# Patient Record
Sex: Female | Born: 1975 | Race: White | Hispanic: No | Marital: Married | Smoking: Former smoker
Health system: Southern US, Community
[De-identification: ages and names within clinical notes are randomized; demographics above are authoritative.]

## PROBLEM LIST (undated history)

## (undated) ENCOUNTER — Inpatient Hospital Stay (HOSPITAL_COMMUNITY): Payer: Self-pay

## (undated) DIAGNOSIS — C419 Malignant neoplasm of bone and articular cartilage, unspecified: Secondary | ICD-10-CM

## (undated) DIAGNOSIS — Z5189 Encounter for other specified aftercare: Secondary | ICD-10-CM

## (undated) DIAGNOSIS — I10 Essential (primary) hypertension: Secondary | ICD-10-CM

## (undated) DIAGNOSIS — O30009 Twin pregnancy, unspecified number of placenta and unspecified number of amniotic sacs, unspecified trimester: Secondary | ICD-10-CM

## (undated) DIAGNOSIS — F429 Obsessive-compulsive disorder, unspecified: Secondary | ICD-10-CM

## (undated) DIAGNOSIS — N289 Disorder of kidney and ureter, unspecified: Secondary | ICD-10-CM

## (undated) DIAGNOSIS — F419 Anxiety disorder, unspecified: Secondary | ICD-10-CM

## (undated) DIAGNOSIS — O09519 Supervision of elderly primigravida, unspecified trimester: Secondary | ICD-10-CM

## (undated) HISTORY — DX: Twin pregnancy, unspecified number of placenta and unspecified number of amniotic sacs, unspecified trimester: O30.009

## (undated) HISTORY — DX: Anxiety disorder, unspecified: F41.9

## (undated) HISTORY — PX: NO PAST SURGERIES: SHX2092

## (undated) HISTORY — PX: WISDOM TOOTH EXTRACTION: SHX21

## (undated) HISTORY — DX: Malignant neoplasm of bone and articular cartilage, unspecified: C41.9

## (undated) HISTORY — PX: LEEP: SHX91

## (undated) HISTORY — PX: OTHER SURGICAL HISTORY: SHX169

## (undated) HISTORY — DX: Obsessive-compulsive disorder, unspecified: F42.9

## (undated) HISTORY — DX: Encounter for other specified aftercare: Z51.89

---

## 2006-07-21 ENCOUNTER — Emergency Department: Payer: Self-pay

## 2009-12-03 HISTORY — PX: TUMOR REMOVAL: SHX12

## 2011-11-06 ENCOUNTER — Encounter (HOSPITAL_COMMUNITY): Payer: Self-pay

## 2012-01-04 ENCOUNTER — Ambulatory Visit (HOSPITAL_COMMUNITY)
Admission: RE | Admit: 2012-01-04 | Discharge: 2012-01-04 | Disposition: A | Discharge status: Home / Self Care | Payer: Medicare Other | Source: Ambulatory Visit | Attending: Gynecology | Admitting: Gynecology

## 2012-01-04 ENCOUNTER — Other Ambulatory Visit: Payer: Self-pay

## 2012-01-15 NOTE — Progress Notes (Signed)
MFM Note  Jackie Wilson is a 36 year old G49 Caucasian female who presents for preconception counseling regarding her chronic renal disease. 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 At this time she is considered disease free; however, due to her treatments, she has acquired chronic kidney disease (stage 3) and chronic hypertension. Her serum creatinine has remained at ~ 1.8 mg/dl for the last few years and she is currently not requiring an antihypertensive medication. Otherwise, she has no other medical or surgical conditions.  We had a very detailed discussion regarding the effects of renal disease on pregnancy and how pregnancy may effect her kidney disease. She understands that she would be at increased risk for preeclampsia, eclampsia, CVA and death. The fetus is at risk for severe growth restriction, very premature delivery, abruption and intrauterine demise. Pregnancy will most likely cause a transient decrease in renal function and, in a small percentage, cause permanent, irreversible damage leading to end-stage renal disease. Another key component to the outcome is how well the hypertension can be controlled.  Given the above risks, however, good outcomes are possible.  Assessment: 1) Moderate chronic kidney disease - stage 3 secondary to chemotherapy 2) Osteogenic sarcoma of right femur; S/P resection and chemotherapy in 2000 4) Chronic hypertension - currently not requiring medication 5) AMA  Recommendations: 1) Ok for pregnancy as long as Jackie Wilson understands the risks to herself and to the pregnancy 2) Baseline labs for renal and liver function; 24 hour urine for Cr Cl and protein; check for asymptomatic bacteriuria; then repeat each trimester 3) Aggressive preconception treatment of hypertension - would begin Rx with BPs > 140/90; starting with Labetalol 4) Very close monitoring for s/s of  preeclampsia 5) Early dating [place] 6) Serial USs for growth 7) Antenatal testing in the early third trimester 8) Continue to be seen by Dr. Hassell Done during pregnancy 9) Offer genetic counseling for [facility] options  Thank you for the kind referral.  (Face-to-face consultation with patient: 30 min)

## 2012-01-21 ENCOUNTER — Other Ambulatory Visit: Payer: Self-pay | Admitting: Gynecology

## 2012-03-06 LAB — OB RESULTS CONSOLE ABO/RH: RH Type: POSITIVE

## 2012-03-06 LAB — OB RESULTS CONSOLE RPR: RPR: NONREACTIVE

## 2012-03-13 LAB — OB RESULTS CONSOLE GC/CHLAMYDIA
Chlamydia: NEGATIVE
Gonorrhea: NEGATIVE

## 2012-07-15 ENCOUNTER — Other Ambulatory Visit: Payer: Self-pay

## 2012-07-22 ENCOUNTER — Other Ambulatory Visit (HOSPITAL_COMMUNITY): Payer: Self-pay | Admitting: Obstetrics and Gynecology

## 2012-07-22 ENCOUNTER — Ambulatory Visit (HOSPITAL_COMMUNITY): Payer: Medicare Other

## 2012-07-22 DIAGNOSIS — IMO0001 Reserved for inherently not codable concepts without codable children: Secondary | ICD-10-CM

## 2012-07-22 DIAGNOSIS — O2690 Pregnancy related conditions, unspecified, unspecified trimester: Secondary | ICD-10-CM

## 2012-07-30 ENCOUNTER — Other Ambulatory Visit (HOSPITAL_COMMUNITY): Payer: Medicare Other

## 2012-07-31 ENCOUNTER — Encounter (HOSPITAL_COMMUNITY): Payer: Self-pay

## 2012-07-31 ENCOUNTER — Other Ambulatory Visit (HOSPITAL_COMMUNITY): Payer: Self-pay | Admitting: Obstetrics and Gynecology

## 2012-07-31 ENCOUNTER — Ambulatory Visit (HOSPITAL_COMMUNITY)
Admission: RE | Admit: 2012-07-31 | Discharge: 2012-07-31 | Disposition: A | Discharge status: Home / Self Care | Payer: BC Managed Care – PPO | Source: Ambulatory Visit | Attending: Obstetrics and Gynecology | Admitting: Obstetrics and Gynecology

## 2012-07-31 DIAGNOSIS — O2690 Pregnancy related conditions, unspecified, unspecified trimester: Secondary | ICD-10-CM

## 2012-07-31 DIAGNOSIS — O30009 Twin pregnancy, unspecified number of placenta and unspecified number of amniotic sacs, unspecified trimester: Secondary | ICD-10-CM | POA: Insufficient documentation

## 2012-07-31 DIAGNOSIS — IMO0001 Reserved for inherently not codable concepts without codable children: Secondary | ICD-10-CM

## 2012-07-31 DIAGNOSIS — O09519 Supervision of elderly primigravida, unspecified trimester: Secondary | ICD-10-CM | POA: Insufficient documentation

## 2012-07-31 DIAGNOSIS — O26839 Pregnancy related renal disease, unspecified trimester: Secondary | ICD-10-CM | POA: Insufficient documentation

## 2012-08-04 ENCOUNTER — Inpatient Hospital Stay (HOSPITAL_COMMUNITY)
Admission: AD | Admit: 2012-08-04 | Discharge: 2012-08-04 | Disposition: A | Discharge status: Home / Self Care | Payer: BC Managed Care – PPO | Source: Ambulatory Visit | Attending: Obstetrics and Gynecology | Admitting: Obstetrics and Gynecology

## 2012-08-04 ENCOUNTER — Encounter (HOSPITAL_COMMUNITY): Payer: Self-pay

## 2012-08-04 DIAGNOSIS — O30009 Twin pregnancy, unspecified number of placenta and unspecified number of amniotic sacs, unspecified trimester: Secondary | ICD-10-CM | POA: Insufficient documentation

## 2012-08-04 HISTORY — DX: Malignant neoplasm of bone and articular cartilage, unspecified: C41.9

## 2012-08-04 HISTORY — DX: Essential (primary) hypertension: I10

## 2012-08-04 HISTORY — DX: Disorder of kidney and ureter, unspecified: N28.9

## 2012-08-04 HISTORY — DX: Supervision of elderly primigravida, unspecified trimester: O09.519

## 2012-08-04 NOTE — MAU Note (Signed)
Pt here for routine NST, denies pain, bleeding or vaginal discharge. Twin gestation, baby a maternal left, vertex, baby b maternal right vertex

## 2012-08-12 ENCOUNTER — Other Ambulatory Visit: Payer: Medicare Other

## 2012-08-12 ENCOUNTER — Ambulatory Visit (INDEPENDENT_AMBULATORY_CARE_PROVIDER_SITE_OTHER): Payer: Medicare Other | Admitting: *Deleted

## 2012-08-12 VITALS — BP 117/78 | Wt 192.7 lb

## 2012-08-12 DIAGNOSIS — Z85830 Personal history of malignant neoplasm of bone: Secondary | ICD-10-CM

## 2012-08-12 DIAGNOSIS — O30009 Twin pregnancy, unspecified number of placenta and unspecified number of amniotic sacs, unspecified trimester: Secondary | ICD-10-CM

## 2012-08-12 DIAGNOSIS — N183 Chronic kidney disease, stage 3 unspecified: Secondary | ICD-10-CM

## 2012-08-12 DIAGNOSIS — O09519 Supervision of elderly primigravida, unspecified trimester: Secondary | ICD-10-CM

## 2012-08-12 HISTORY — DX: Twin pregnancy, unspecified number of placenta and unspecified number of amniotic sacs, unspecified trimester: O30.009

## 2012-08-12 NOTE — Progress Notes (Signed)
P = 92   Copy of report and tracing sent to Dr. Helane Rima w/pt today

## 2012-08-15 ENCOUNTER — Ambulatory Visit (INDEPENDENT_AMBULATORY_CARE_PROVIDER_SITE_OTHER): Payer: Medicare Other | Admitting: *Deleted

## 2012-08-15 VITALS — BP 108/68 | Wt 193.1 lb

## 2012-08-15 DIAGNOSIS — O30009 Twin pregnancy, unspecified number of placenta and unspecified number of amniotic sacs, unspecified trimester: Secondary | ICD-10-CM

## 2012-08-15 NOTE — Progress Notes (Signed)
P = 91   Copy of report and tracing sent to Dr. Gaetano Net w/pt today.

## 2012-08-19 ENCOUNTER — Ambulatory Visit (INDEPENDENT_AMBULATORY_CARE_PROVIDER_SITE_OTHER): Payer: Medicare Other | Admitting: *Deleted

## 2012-08-19 VITALS — BP 103/62 | Wt 194.8 lb

## 2012-08-19 DIAGNOSIS — O30009 Twin pregnancy, unspecified number of placenta and unspecified number of amniotic sacs, unspecified trimester: Secondary | ICD-10-CM

## 2012-08-19 NOTE — Progress Notes (Signed)
MFM Notes  Jackie Wilson is a 36 year old G67 Caucasian female with a di/di twin gestation at 31+ weeks who presents for consultation regarding her renal disease and recent changes in her renal function tests. I met Jackie Wilson earlier this year for a preconception consultation due to her chronic renal disease secondary to chemotherapy for osteogenic sarcoma (please see note from 02/13). Since that time she has conceived with a di/di twin gestation. Her prenatal course has been uneventful to this point. A serum Cr, CrCl and 24 hour protein were performed on 08/13. These results were compared to those performed on 01/22/12. The serum creatinine improved slightly going from 1.78 mg/dl to 1.59 mg/dl. Her CrCl also improved with a clearance of 53 ml/min to 60 ml/min. The only change that was significantly different was the total protein that went from 154 mg/day to 728 mg/day.   BP today was 105/64. An US revealed normally grown fetuses, normal AFVs and no third trimester anomalies.  We discussed that the increase in proteinuria was concerning for being a possible early sign of preeclampsia and/or worsening renal function. Therefore, at this point, I suggest 1) starting twice weekly testing (NSTs with weekly fluid assessments); 2) continuing serial USs for growth; 3) assessing her BP at each visit (twice weekly) and 4) obtaining blood work and a 24 hour urine every 2-3 weeks. As long as her BPs remain normal and HELLP is ruled-out, outpatient management is reasonable. If the proteinuria continues to increase with all else stable, would consider a 36 week delivery; otherwise deliver at 38 weeks.  It was nice seeing Jackie Wilson again! Please call with questions or concerns.  (Face-to-face consultation with patient: 25 min)

## 2012-08-19 NOTE — Progress Notes (Signed)
P = 91    Copy of report and tracing sent to Dr. Corinna Capra w/pt today

## 2012-08-22 ENCOUNTER — Ambulatory Visit (INDEPENDENT_AMBULATORY_CARE_PROVIDER_SITE_OTHER): Payer: Medicaid Other | Admitting: *Deleted

## 2012-08-22 VITALS — BP 108/61 | Wt 195.5 lb

## 2012-08-22 DIAGNOSIS — O30009 Twin pregnancy, unspecified number of placenta and unspecified number of amniotic sacs, unspecified trimester: Secondary | ICD-10-CM

## 2012-08-22 NOTE — Progress Notes (Signed)
P = 78   Copy of report and tracing sent to Dr. Matthew Saras w/pt today.

## 2012-08-25 ENCOUNTER — Ambulatory Visit (INDEPENDENT_AMBULATORY_CARE_PROVIDER_SITE_OTHER): Payer: Medicare Other | Admitting: *Deleted

## 2012-08-25 VITALS — BP 106/67 | Wt 196.8 lb

## 2012-08-25 DIAGNOSIS — O30009 Twin pregnancy, unspecified number of placenta and unspecified number of amniotic sacs, unspecified trimester: Secondary | ICD-10-CM

## 2012-08-25 NOTE — Progress Notes (Signed)
P = 90   Copy of report and tracing sent to Dr. Corinna Capra w/pt today

## 2012-08-29 ENCOUNTER — Ambulatory Visit (INDEPENDENT_AMBULATORY_CARE_PROVIDER_SITE_OTHER): Payer: Medicare Other | Admitting: *Deleted

## 2012-08-29 VITALS — BP 102/65 | Wt 198.0 lb

## 2012-08-29 DIAGNOSIS — O30009 Twin pregnancy, unspecified number of placenta and unspecified number of amniotic sacs, unspecified trimester: Secondary | ICD-10-CM

## 2012-08-29 NOTE — Progress Notes (Signed)
P = 81  Copy of NST report and tracing sent to Dr. Helane Rima w/pt today

## 2012-09-01 ENCOUNTER — Ambulatory Visit (INDEPENDENT_AMBULATORY_CARE_PROVIDER_SITE_OTHER): Payer: Medicare Other | Admitting: *Deleted

## 2012-09-01 VITALS — BP 104/68 | Wt 198.5 lb

## 2012-09-01 DIAGNOSIS — O30009 Twin pregnancy, unspecified number of placenta and unspecified number of amniotic sacs, unspecified trimester: Secondary | ICD-10-CM

## 2012-09-01 NOTE — Progress Notes (Signed)
P = 89   Copy of NST report and tracing sent to Dr. Corinna Capra w/pt today

## 2012-09-05 ENCOUNTER — Ambulatory Visit (INDEPENDENT_AMBULATORY_CARE_PROVIDER_SITE_OTHER): Payer: Medicare Other | Admitting: *Deleted

## 2012-09-05 VITALS — BP 106/68 | Wt 198.5 lb

## 2012-09-05 DIAGNOSIS — O30009 Twin pregnancy, unspecified number of placenta and unspecified number of amniotic sacs, unspecified trimester: Secondary | ICD-10-CM

## 2012-09-05 NOTE — Progress Notes (Signed)
P = 82   Copy of NST report and tracing faxed to Dr. Corinna Capra.  Pt is scheduled for IOL on 09/08/12

## 2012-09-08 ENCOUNTER — Encounter (HOSPITAL_COMMUNITY): Payer: Self-pay | Admitting: Anesthesiology

## 2012-09-08 ENCOUNTER — Encounter (HOSPITAL_COMMUNITY)
Admission: RE | Disposition: A | Discharge status: Home / Self Care | Payer: Self-pay | Source: Ambulatory Visit | Attending: Obstetrics and Gynecology

## 2012-09-08 ENCOUNTER — Inpatient Hospital Stay (HOSPITAL_COMMUNITY)
Admission: RE | Admit: 2012-09-08 | Discharge: 2012-09-11 | DRG: 371 | Disposition: A | Discharge status: Home / Self Care | Payer: BC Managed Care – PPO | Source: Ambulatory Visit | Attending: Obstetrics and Gynecology | Admitting: Obstetrics and Gynecology

## 2012-09-08 ENCOUNTER — Encounter (HOSPITAL_COMMUNITY): Payer: Self-pay

## 2012-09-08 ENCOUNTER — Other Ambulatory Visit: Payer: Medicare Other

## 2012-09-08 ENCOUNTER — Inpatient Hospital Stay (HOSPITAL_COMMUNITY): Payer: BC Managed Care – PPO | Admitting: Anesthesiology

## 2012-09-08 DIAGNOSIS — Z85830 Personal history of malignant neoplasm of bone: Secondary | ICD-10-CM

## 2012-09-08 DIAGNOSIS — O09519 Supervision of elderly primigravida, unspecified trimester: Secondary | ICD-10-CM | POA: Diagnosis present

## 2012-09-08 DIAGNOSIS — Z22330 Carrier of Group B streptococcus: Secondary | ICD-10-CM

## 2012-09-08 DIAGNOSIS — O99892 Other specified diseases and conditions complicating childbirth: Secondary | ICD-10-CM | POA: Diagnosis present

## 2012-09-08 DIAGNOSIS — O30009 Twin pregnancy, unspecified number of placenta and unspecified number of amniotic sacs, unspecified trimester: Secondary | ICD-10-CM | POA: Diagnosis present

## 2012-09-08 DIAGNOSIS — O139 Gestational [pregnancy-induced] hypertension without significant proteinuria, unspecified trimester: Secondary | ICD-10-CM | POA: Diagnosis present

## 2012-09-08 LAB — COMPREHENSIVE METABOLIC PANEL
ALT: 21 U/L (ref 0–35)
Alkaline Phosphatase: 215 U/L — ABNORMAL HIGH (ref 39–117)
CO2: 20 mEq/L (ref 19–32)
Calcium: 8.9 mg/dL (ref 8.4–10.5)
GFR calc Af Amer: 37 mL/min — ABNORMAL LOW (ref >90)
GFR calc non Af Amer: 32 mL/min — ABNORMAL LOW (ref >90)
Glucose, Bld: 84 mg/dL (ref 70–99)
Potassium: 4 mEq/L (ref 3.5–5.1)
Sodium: 135 mEq/L (ref 135–145)
Total Bilirubin: 0.2 mg/dL — ABNORMAL LOW (ref 0.3–1.2)

## 2012-09-08 LAB — CBC
HCT: 35.7 % — ABNORMAL LOW (ref 36.0–46.0)
HCT: 34 % — ABNORMAL LOW (ref 36.0–46.0)
HCT: 28.2 % — ABNORMAL LOW (ref 36.0–46.0)
Hemoglobin: 11.9 g/dL — ABNORMAL LOW (ref 12.0–15.0)
Hemoglobin: 9.5 g/dL — ABNORMAL LOW (ref 12.0–15.0)
MCH: 27.7 pg (ref 26.0–34.0)
MCHC: 33.3 g/dL (ref 30.0–36.0)
MCHC: 32.6 g/dL (ref 30.0–36.0)
MCHC: 33.7 g/dL (ref 30.0–36.0)
MCV: 82.9 fL (ref 78.0–100.0)
Platelets: 127 10*3/uL — ABNORMAL LOW (ref 150–400)
RBC: 3.43 MIL/uL — ABNORMAL LOW (ref 3.87–5.11)
RDW: 13.5 % (ref 11.5–15.5)

## 2012-09-08 LAB — PREPARE RBC (CROSSMATCH)

## 2012-09-08 SURGERY — Surgical Case
Anesthesia: Regional

## 2012-09-08 MED ORDER — SODIUM BICARBONATE 8.4 % IV SOLN
INTRAVENOUS | Status: DC | PRN
  Administered 2012-09-08 (×2): 5 mL via EPIDURAL

## 2012-09-08 MED ORDER — OXYCODONE-ACETAMINOPHEN 5-325 MG PO TABS: 1.0000 | ORAL_TABLET | ORAL | Status: DC | PRN

## 2012-09-08 MED ORDER — LIDOCAINE-EPINEPHRINE (PF) 2 %-1:200000 IJ SOLN
INTRAMUSCULAR | Status: AC
  Filled 2012-09-08: qty 20

## 2012-09-08 MED ORDER — PHENYLEPHRINE 40 MCG/ML (10ML) SYRINGE FOR IV PUSH (FOR BLOOD PRESSURE SUPPORT): 80.0000 ug | PREFILLED_SYRINGE | INTRAVENOUS | Status: DC | PRN

## 2012-09-08 MED ORDER — FLEET ENEMA 7-19 GM/118ML RE ENEM: 1.0000 | ENEMA | Freq: Every day | RECTAL | Status: DC | PRN

## 2012-09-08 MED ORDER — ONDANSETRON HCL 4 MG/2ML IJ SOLN
INTRAMUSCULAR | Status: DC | PRN
  Administered 2012-09-08: 4 mg via INTRAVENOUS

## 2012-09-08 MED ORDER — IBUPROFEN 600 MG PO TABS: 600.0000 mg | ORAL_TABLET | Freq: Four times a day (QID) | ORAL | Status: DC | PRN

## 2012-09-08 MED ORDER — ACETAMINOPHEN 325 MG PO TABS: 650.0000 mg | ORAL_TABLET | ORAL | Status: DC | PRN

## 2012-09-08 MED ORDER — ACETAMINOPHEN 10 MG/ML IV SOLN
1000.0000 mg | Freq: Once | INTRAVENOUS | Status: DC | PRN
  Filled 2012-09-08: qty 100

## 2012-09-08 MED ORDER — PHENYLEPHRINE HCL 10 MG/ML IJ SOLN
INTRAMUSCULAR | Status: DC | PRN
  Administered 2012-09-08 (×3): 40 ug via INTRAVENOUS
  Administered 2012-09-08: 80 ug via INTRAVENOUS
  Administered 2012-09-08 (×2): 40 ug via INTRAVENOUS
  Administered 2012-09-08: 80 ug via INTRAVENOUS
  Administered 2012-09-08: 40 ug via INTRAVENOUS

## 2012-09-08 MED ORDER — MEPERIDINE HCL 25 MG/ML IJ SOLN: 6.2500 mg | INTRAMUSCULAR | Status: DC | PRN

## 2012-09-08 MED ORDER — PHENYLEPHRINE 40 MCG/ML (10ML) SYRINGE FOR IV PUSH (FOR BLOOD PRESSURE SUPPORT)
PREFILLED_SYRINGE | INTRAVENOUS | Status: AC
  Filled 2012-09-08: qty 5

## 2012-09-08 MED ORDER — OXYTOCIN BOLUS FROM INFUSION
500.0000 mL | Freq: Once | INTRAVENOUS | Status: DC
  Filled 2012-09-08: qty 500

## 2012-09-08 MED ORDER — DEXTROSE 5 % IV SOLN
2.0000 g | INTRAVENOUS | Status: DC
  Filled 2012-09-08: qty 2

## 2012-09-08 MED ORDER — MORPHINE SULFATE 0.5 MG/ML IJ SOLN
INTRAMUSCULAR | Status: AC
  Filled 2012-09-08: qty 10

## 2012-09-08 MED ORDER — PRENATAL MULTIVITAMIN CH: 1.0000 | ORAL_TABLET | Freq: Every day | ORAL | Status: DC

## 2012-09-08 MED ORDER — OXYTOCIN 40 UNITS IN LACTATED RINGERS INFUSION - SIMPLE MED: 62.5000 mL/h | Freq: Once | INTRAVENOUS | Status: DC

## 2012-09-08 MED ORDER — DIPHENHYDRAMINE HCL 50 MG/ML IJ SOLN: 12.5000 mg | INTRAMUSCULAR | Status: DC | PRN

## 2012-09-08 MED ORDER — LACTATED RINGERS IV SOLN
500.0000 mL | Freq: Once | INTRAVENOUS | Status: DC
  Administered 2012-09-08: 1000 mL via INTRAVENOUS

## 2012-09-08 MED ORDER — CITRIC ACID-SODIUM CITRATE 334-500 MG/5ML PO SOLN
30.0000 mL | ORAL | Status: DC | PRN
  Administered 2012-09-08: 30 mL via ORAL
  Filled 2012-09-08: qty 15

## 2012-09-08 MED ORDER — SODIUM CHLORIDE 0.9 % IV SOLN
2.0000 g | Freq: Four times a day (QID) | INTRAVENOUS | Status: DC
  Administered 2012-09-08 (×2): 2 g via INTRAVENOUS
  Filled 2012-09-08 (×4): qty 2000

## 2012-09-08 MED ORDER — EPHEDRINE 5 MG/ML INJ: 10.0000 mg | INTRAVENOUS | Status: DC | PRN

## 2012-09-08 MED ORDER — LIDOCAINE HCL (PF) 1 % IJ SOLN: 30.0000 mL | INTRAMUSCULAR | Status: DC | PRN

## 2012-09-08 MED ORDER — LIDOCAINE HCL (PF) 1 % IJ SOLN
INTRAMUSCULAR | Status: DC | PRN
  Administered 2012-09-08 (×2): 5 mL

## 2012-09-08 MED ORDER — EPHEDRINE 5 MG/ML INJ
INTRAVENOUS | Status: AC
  Filled 2012-09-08: qty 10

## 2012-09-08 MED ORDER — FENTANYL CITRATE 0.05 MG/ML IJ SOLN: 25.0000 ug | INTRAMUSCULAR | Status: DC | PRN

## 2012-09-08 MED ORDER — SODIUM BICARBONATE 8.4 % IV SOLN
INTRAVENOUS | Status: AC
  Filled 2012-09-08: qty 50

## 2012-09-08 MED ORDER — TERBUTALINE SULFATE 1 MG/ML IJ SOLN: 0.2500 mg | Freq: Once | INTRAMUSCULAR | Status: DC | PRN

## 2012-09-08 MED ORDER — ONDANSETRON HCL 4 MG/2ML IJ SOLN: 4.0000 mg | Freq: Four times a day (QID) | INTRAMUSCULAR | Status: DC | PRN

## 2012-09-08 MED ORDER — PHENYLEPHRINE 40 MCG/ML (10ML) SYRINGE FOR IV PUSH (FOR BLOOD PRESSURE SUPPORT)
PREFILLED_SYRINGE | INTRAVENOUS | Status: AC
  Filled 2012-09-08: qty 15

## 2012-09-08 MED ORDER — MEPERIDINE HCL 25 MG/ML IJ SOLN
INTRAMUSCULAR | Status: AC
  Filled 2012-09-08: qty 1

## 2012-09-08 MED ORDER — MEPERIDINE HCL 25 MG/ML IJ SOLN
INTRAMUSCULAR | Status: DC | PRN
  Administered 2012-09-08: 12.5 mg via INTRAVENOUS

## 2012-09-08 MED ORDER — LACTATED RINGERS IV SOLN
INTRAVENOUS | Status: DC
  Administered 2012-09-08 (×3): via INTRAVENOUS

## 2012-09-08 MED ORDER — FENTANYL 2.5 MCG/ML BUPIVACAINE 1/10 % EPIDURAL INFUSION (WH - ANES)
14.0000 mL/h | INTRAMUSCULAR | Status: DC
Start: 2012-09-08 — End: 2012-09-08
  Administered 2012-09-08: 14 mL/h via EPIDURAL
  Filled 2012-09-08: qty 125

## 2012-09-08 MED ORDER — MORPHINE SULFATE (PF) 0.5 MG/ML IJ SOLN
INTRAMUSCULAR | Status: DC | PRN
  Administered 2012-09-08: 4 mg via EPIDURAL

## 2012-09-08 MED ORDER — OXYTOCIN 40 UNITS IN LACTATED RINGERS INFUSION - SIMPLE MED
1.0000 m[IU]/min | INTRAVENOUS | Status: DC
  Administered 2012-09-08: 2 m[IU]/min via INTRAVENOUS
  Filled 2012-09-08: qty 1000

## 2012-09-08 MED ORDER — SCOPOLAMINE 1 MG/3DAYS TD PT72
MEDICATED_PATCH | TRANSDERMAL | Status: AC
  Administered 2012-09-08: 1.5 mg via TRANSDERMAL
  Filled 2012-09-08: qty 1

## 2012-09-08 MED ORDER — LACTATED RINGERS IV SOLN
500.0000 mL | INTRAVENOUS | Status: DC | PRN
  Administered 2012-09-08: 1000 mL via INTRAVENOUS

## 2012-09-08 MED ORDER — LACTATED RINGERS IV SOLN
INTRAVENOUS | Status: DC | PRN
  Administered 2012-09-08 (×3): via INTRAVENOUS

## 2012-09-08 MED ORDER — OXYTOCIN 10 UNIT/ML IJ SOLN
40.0000 [IU] | INTRAVENOUS | Status: DC | PRN
  Administered 2012-09-08: 40 [IU] via INTRAVENOUS

## 2012-09-08 MED ORDER — ONDANSETRON HCL 4 MG/2ML IJ SOLN
INTRAMUSCULAR | Status: AC
  Filled 2012-09-08: qty 2

## 2012-09-08 MED ORDER — DEXTROSE 5 % IV SOLN
2.0000 g | INTRAVENOUS | Status: DC | PRN
  Administered 2012-09-08: 2 g via INTRAVENOUS

## 2012-09-08 MED ORDER — PHENYLEPHRINE 40 MCG/ML (10ML) SYRINGE FOR IV PUSH (FOR BLOOD PRESSURE SUPPORT)
80.0000 ug | PREFILLED_SYRINGE | INTRAVENOUS | Status: DC | PRN
  Filled 2012-09-08: qty 5

## 2012-09-08 MED ORDER — EPHEDRINE 5 MG/ML INJ
10.0000 mg | INTRAVENOUS | Status: DC | PRN
  Filled 2012-09-08: qty 4

## 2012-09-08 MED ORDER — SCOPOLAMINE 1 MG/3DAYS TD PT72
1.0000 | MEDICATED_PATCH | Freq: Once | TRANSDERMAL | Status: DC
  Administered 2012-09-08: 1.5 mg via TRANSDERMAL

## 2012-09-08 MED ORDER — OXYTOCIN 10 UNIT/ML IJ SOLN
INTRAMUSCULAR | Status: AC
  Filled 2012-09-08: qty 4

## 2012-09-08 SURGICAL SUPPLY — 34 items
CLOTH BEACON ORANGE TIMEOUT ST (SAFETY) ×2 IMPLANT
DRAPE SURG 17X23 STRL (DRAPES) ×2 IMPLANT
DRESSING TELFA 8X3 (GAUZE/BANDAGES/DRESSINGS) IMPLANT
DRSG COVADERM 4X10 (GAUZE/BANDAGES/DRESSINGS) ×4 IMPLANT
DURAPREP 26ML APPLICATOR (WOUND CARE) ×2 IMPLANT
ELECT REM PT RETURN 9FT ADLT (ELECTROSURGICAL) ×2
ELECTRODE REM PT RTRN 9FT ADLT (ELECTROSURGICAL) ×1 IMPLANT
EXTRACTOR VACUUM M CUP 4 TUBE (SUCTIONS) IMPLANT
GAUZE SPONGE 4X4 12PLY STRL LF (GAUZE/BANDAGES/DRESSINGS) IMPLANT
GLOVE BIO SURGEON STRL SZ8 (GLOVE) ×2 IMPLANT
GLOVE BIOGEL PI IND STRL 7.5 (GLOVE) ×1 IMPLANT
GLOVE BIOGEL PI INDICATOR 7.5 (GLOVE) ×1
GLOVE SURG ORTHO 8.0 STRL STRW (GLOVE) ×2 IMPLANT
GLOVE SURG SS PI 7.5 STRL IVOR (GLOVE) ×2 IMPLANT
GOWN PREVENTION PLUS LG XLONG (DISPOSABLE) ×4 IMPLANT
KIT ABG SYR 3ML LUER SLIP (SYRINGE) ×4 IMPLANT
NEEDLE HYPO 25X5/8 SAFETYGLIDE (NEEDLE) ×4 IMPLANT
NS IRRIG 1000ML POUR BTL (IV SOLUTION) ×2 IMPLANT
PACK C SECTION WH (CUSTOM PROCEDURE TRAY) ×2 IMPLANT
PAD ABD 7.5X8 STRL (GAUZE/BANDAGES/DRESSINGS) IMPLANT
PAD OB MATERNITY 4.3X12.25 (PERSONAL CARE ITEMS) ×2 IMPLANT
SLEEVE SCD COMPRESS KNEE MED (MISCELLANEOUS) ×2 IMPLANT
STAPLER VISISTAT 35W (STAPLE) IMPLANT
SUT MNCRL 0 VIOLET CTX 36 (SUTURE) ×3 IMPLANT
SUT MONOCRYL 0 CTX 36 (SUTURE) ×3
SUT PDS AB 1 CT  36 (SUTURE) ×1
SUT PDS AB 1 CT 36 (SUTURE) ×1 IMPLANT
SUT PLAIN 2 0 XLH (SUTURE) ×2 IMPLANT
SUT VIC AB 1 CTX 36 (SUTURE)
SUT VIC AB 1 CTX36XBRD ANBCTRL (SUTURE) IMPLANT
TOWEL OR 17X24 6PK STRL BLUE (TOWEL DISPOSABLE) ×4 IMPLANT
TRAY FOLEY CATH 14FR (SET/KITS/TRAYS/PACK) IMPLANT
WATER STERILE IRR 1000ML POUR (IV SOLUTION) ×2 IMPLANT
YANKAUER SUCT BULB TIP NO VENT (SUCTIONS) ×2 IMPLANT

## 2012-09-08 NOTE — Progress Notes (Signed)
Patient ID: Jackie Wilson, female   DOB: [birth year], 36 y.o.   MRN: [id number] CTSP for recurrent late decels and then bradycardia for twin a Responded to Pitocin d/c and IVF, O2 and position change Now with moderate varibility and no decels FHR B moderate varibility and no decels Ctxs were q 2-3 minutes now q 5-7 Cx: 3/90/-2  Fetal intolerance to labor.  Plan Primary C/S Risks and benefits of C/S were discussed.  All questions were answered and informed consent was obtained.  Plan to proceed with low segment transverse Cesarean Section.   DL

## 2012-09-08 NOTE — Anesthesia Procedure Notes (Signed)
Epidural Patient location during procedure: OB Start time: 09/08/2012 5:05 PM  Staffing Anesthesiologist: Rudean Curt R Performed by: anesthesiologist   Preanesthetic Checklist Completed: patient identified, site marked, surgical consent, pre-op evaluation, timeout performed, IV checked, risks and benefits discussed and monitors and equipment checked  Epidural Patient position: sitting Prep: site prepped and draped and DuraPrep Patient monitoring: continuous pulse ox and blood pressure Approach: midline Injection technique: LOR air and LOR saline  Needle:  Needle type: Tuohy  Needle gauge: 17 G Needle length: 9 cm and 9 Needle insertion depth: 5 cm cm Catheter type: closed end flexible Catheter size: 19 Gauge Catheter at skin depth: 10 cm Test dose: negative  Assessment Events: blood not aspirated, injection not painful, no injection resistance, negative IV test and no paresthesia  Additional Notes Patient identified.  Risk benefits discussed including failed block, incomplete pain control, headache, nerve damage, paralysis, blood pressure changes, nausea, vomiting, reactions to medication both toxic or allergic, and postpartum back pain.  Patient expressed understanding and wished to proceed.  All questions were answered.  Sterile technique used throughout procedure and epidural site dressed with sterile barrier dressing. No paresthesia or other complications noted.The patient did not experience any signs of intravascular injection such as tinnitus or metallic taste in mouth nor signs of intrathecal spread such as rapid motor block. Please see nursing notes for vital signs.

## 2012-09-08 NOTE — Anesthesia Preprocedure Evaluation (Signed)
Anesthesia Evaluation  Patient identified by MRN, date of birth, ID band Patient awake    Reviewed: Allergy & Precautions, H&P , Patient's Chart, lab work & pertinent test results  Airway Mallampati: II TM Distance: >3 FB Neck ROM: full    Dental No notable dental hx.    Pulmonary neg pulmonary ROS,  breath sounds clear to auscultation  Pulmonary exam normal       Cardiovascular hypertension, negative cardio ROS  Rhythm:regular Rate:Normal     Neuro/Psych negative neurological ROS  negative psych ROS   GI/Hepatic negative GI ROS, Neg liver ROS,   Endo/Other  negative endocrine ROS  Renal/GU CRFRenal diseasenegative Renal ROS     Musculoskeletal   Abdominal   Peds  Hematology negative hematology ROS (+)   Anesthesia Other Findings Osteosarcoma     AMA (advanced maternal age) primigravida 35+        Renal insufficiency   Secondary to chemotherapy Hypertension    Reproductive/Obstetrics (+) Pregnancy                           Anesthesia Physical Anesthesia Plan  ASA: III  Anesthesia Plan: Epidural   Post-op Pain Management:    Induction:   Airway Management Planned:   Additional Equipment:   Intra-op Plan:   Post-operative Plan:   Informed Consent: I have reviewed the patients History and Physical, chart, labs and discussed the procedure including the risks, benefits and alternatives for the proposed anesthesia with the patient or authorized representative who has indicated his/her understanding and acceptance.     Plan Discussed with:   Anesthesia Plan Comments:         Anesthesia Quick Evaluation

## 2012-09-08 NOTE — H&P (Signed)
Jackie Wilson is a 36 y.o. female presenting for IOL due to twins at 25 weeks, early labor sxs, gestational HTN on bedrest and a history of renal insufficiency.  GBS +History OB History    Grav Para Term Preterm Abortions TAB SAB Ect Mult Living   1              Past Medical History  Diagnosis Date  . Osteosarcoma   . AMA (advanced maternal age) primigravida 42+   . Renal insufficiency     Secondary to chemotherapy  . Hypertension    Past Surgical History  Procedure Date  . Right leg replacement   . Leep    Family History: family history is negative for Other. Social History:  reports that she has never smoked. She has never used smokeless tobacco. She reports that she does not drink alcohol or use illicit drugs.   Prenatal Transfer Tool  Maternal Diabetes: No Genetic Screening: Normal Maternal Ultrasounds/Referrals: Normal Fetal Ultrasounds or other Referrals:  None Maternal Substance Abuse:  No Significant Maternal Medications:  None Significant Maternal Lab Results:  None Other Comments:  None  ROS    Blood pressure 102/57, pulse 73, temperature 98 F (36.7 C), temperature source Oral, resp. rate 16, height 5\' 3"  (1.6 m), weight 89.812 kg (198 lb), last menstrual period 12/24/2011. Exam Physical Exam   Cx 2/90/-2  AROM clear FSE placed Prenatal labs: ABO, Rh:   Antibody:   Rubella:   RPR:    HBsAg:    HIV:    GBS:     Assessment/Plan: Twins V/V at 37 weeks with early labor and CRI.  Has been on BR for 2 weeks for gest HTN with good results Desires vaginal delivery.  Plan double set up Abx for GBS Labs pending   Sabrina Arriaga C 09/08/2012, 9:43 AM

## 2012-09-08 NOTE — Transfer of Care (Signed)
Immediate Anesthesia Transfer of Care Note  Patient: Jackie Wilson  Procedure(s) Performed: Procedure(s) (LRB) with comments: CESAREAN SECTION (N/A)  Patient Location: PACU  Anesthesia Type: Epidural  Level of Consciousness: awake, alert  and oriented  Airway & Oxygen Therapy: Patient Spontanous Breathing  Post-op Assessment: Report given to PACU RN and Post -op Vital signs reviewed and stable  Post vital signs: Reviewed and stable  Complications: No apparent anesthesia complications

## 2012-09-08 NOTE — Anesthesia Postprocedure Evaluation (Signed)
Anesthesia Post Note  Patient: Jackie Wilson  Procedure(s) Performed: Procedure(s) (LRB): CESAREAN SECTION (N/A)  Anesthesia type: Epidural  Patient location: PACU  Post pain: Pain level controlled  Post assessment: Post-op Vital signs reviewed  Last Vitals:  Filed Vitals:   09/08/12 2230  BP: 101/71  Pulse: 93  Temp:   Resp: 24    Post vital signs: stable  Level of consciousness: awake  Complications: No apparent anesthesia complications

## 2012-09-08 NOTE — Op Note (Signed)
Cesarean Section Procedure Note  Pre-operative Diagnosis: Twins at 37 weeks, Fetal intolerance to labor  Post-operative Diagnosis: same  Surgeon: Luz Lex   Assistants: none  Anesthesia:epidural  Procedure:  Low Segment Transverse cesarean section  Procedure Details  The patient was seen in the Holding Room. The risks, benefits, complications, treatment options, and expected outcomes were discussed with the patient.  The patient concurred with the proposed plan, giving informed consent.  The site of surgery properly noted/marked.. A Time Out was held and the above information confirmed.  After induction of anesthesia, the patient was draped and prepped in the usual sterile manner. A Pfannenstiel incision was made and carried down through the subcutaneous tissue to the fascia. Fascial incision was made and extended transversely. The fascia was separated from the underlying rectus tissue superiorly and inferiorly. The peritoneum was identified and entered. Peritoneal incision was extended longitudinally. The utero-vesical peritoneal reflection was incised transversely and the bladder flap was bluntly freed from the lower uterine segment. A low transverse uterine incision was made. Delivered from vertex presentation was baby A (female) with Apgar scores of 9 at one minute and 9 at five minutes. After the umbilical cord was clamped and cut cord blood was obtained for evaluation. Baby B (female) AROM then delivered from vertex presentation with apgars of 9,9.  The placentas were removed intact and appeared normal with a clamp left on cord from A. The uterine outline, tubes and ovaries appeared normal. The uterine incision was closed with running locked sutures of 0 monocryl and imbricated with 0 monocryl. Hemostasis was observed. Lavage was carried out until clear. The peritoneum was then closed with 0 monocryl and rectus muscles plicated in the midline.  After hemostasis was assured, the fascia was  then reapproximated with running sutures of 1 PDS. Irrigation was applied and after adequate hemostasis was assured, the skin was reapproximated with staples.  Instrument, sponge, and needle counts were correct prior the abdominal closure and at the conclusion of the case. The patient received 2 grams cefotetan preoperatively.  Findings: Viable female (a), viable female (b)  Estimated Blood Loss:  700cc         Specimens: Placenta was sent to Pathology         Complications:  None

## 2012-09-09 ENCOUNTER — Encounter (HOSPITAL_COMMUNITY): Payer: Self-pay | Admitting: Obstetrics and Gynecology

## 2012-09-09 LAB — CBC
HCT: 27.2 % — ABNORMAL LOW (ref 36.0–46.0)
MCH: 28 pg (ref 26.0–34.0)
MCV: 82.7 fL (ref 78.0–100.0)
Platelets: 122 10*3/uL — ABNORMAL LOW (ref 150–400)
RBC: 3.29 MIL/uL — ABNORMAL LOW (ref 3.87–5.11)
RDW: 13.3 % (ref 11.5–15.5)

## 2012-09-09 MED ORDER — OXYCODONE-ACETAMINOPHEN 5-325 MG PO TABS
1.0000 | ORAL_TABLET | ORAL | Status: DC | PRN
  Administered 2012-09-09: 2 via ORAL
  Administered 2012-09-09 (×2): 1 via ORAL
  Administered 2012-09-10 – 2012-09-11 (×6): 2 via ORAL
  Filled 2012-09-09 (×2): qty 2
  Filled 2012-09-09: qty 1
  Filled 2012-09-09 (×4): qty 2
  Filled 2012-09-09: qty 1
  Filled 2012-09-09: qty 2

## 2012-09-09 MED ORDER — DIBUCAINE 1 % RE OINT: 1.0000 "application " | TOPICAL_OINTMENT | RECTAL | Status: DC | PRN

## 2012-09-09 MED ORDER — DIPHENHYDRAMINE HCL 25 MG PO CAPS: 25.0000 mg | ORAL_CAPSULE | Freq: Four times a day (QID) | ORAL | Status: DC | PRN

## 2012-09-09 MED ORDER — ONDANSETRON HCL 4 MG/2ML IJ SOLN: 4.0000 mg | INTRAMUSCULAR | Status: DC | PRN

## 2012-09-09 MED ORDER — OXYTOCIN 40 UNITS IN LACTATED RINGERS INFUSION - SIMPLE MED: 62.5000 mL/h | INTRAVENOUS | Status: AC

## 2012-09-09 MED ORDER — DIPHENHYDRAMINE HCL 25 MG PO CAPS: 25.0000 mg | ORAL_CAPSULE | ORAL | Status: DC | PRN

## 2012-09-09 MED ORDER — NALBUPHINE HCL 10 MG/ML IJ SOLN
5.0000 mg | INTRAMUSCULAR | Status: DC | PRN
  Filled 2012-09-09: qty 1

## 2012-09-09 MED ORDER — SODIUM CHLORIDE 0.9 % IJ SOLN: 3.0000 mL | INTRAMUSCULAR | Status: DC | PRN

## 2012-09-09 MED ORDER — SIMETHICONE 80 MG PO CHEW
80.0000 mg | CHEWABLE_TABLET | Freq: Three times a day (TID) | ORAL | Status: DC
  Administered 2012-09-09 – 2012-09-10 (×6): 80 mg via ORAL

## 2012-09-09 MED ORDER — HYDROMORPHONE HCL PF 1 MG/ML IJ SOLN
0.5000 mg | INTRAMUSCULAR | Status: AC
  Administered 2012-09-09: 0.5 mg via INTRAVENOUS
  Filled 2012-09-09: qty 1

## 2012-09-09 MED ORDER — IBUPROFEN 600 MG PO TABS: 600.0000 mg | ORAL_TABLET | Freq: Four times a day (QID) | ORAL | Status: DC

## 2012-09-09 MED ORDER — PRENATAL MULTIVITAMIN CH
1.0000 | ORAL_TABLET | Freq: Every day | ORAL | Status: DC
  Administered 2012-09-09 – 2012-09-10 (×2): 1 via ORAL
  Filled 2012-09-09 (×2): qty 1

## 2012-09-09 MED ORDER — METOCLOPRAMIDE HCL 5 MG/ML IJ SOLN
10.0000 mg | Freq: Three times a day (TID) | INTRAMUSCULAR | Status: DC | PRN
  Administered 2012-09-09: 10 mg via INTRAVENOUS
  Filled 2012-09-09: qty 2

## 2012-09-09 MED ORDER — DIPHENHYDRAMINE HCL 50 MG/ML IJ SOLN: 25.0000 mg | INTRAMUSCULAR | Status: DC | PRN

## 2012-09-09 MED ORDER — ZOLPIDEM TARTRATE 5 MG PO TABS: 5.0000 mg | ORAL_TABLET | Freq: Every evening | ORAL | Status: DC | PRN

## 2012-09-09 MED ORDER — LACTATED RINGERS IV SOLN
INTRAVENOUS | Status: DC
  Administered 2012-09-09: 05:00:00 via INTRAVENOUS

## 2012-09-09 MED ORDER — SIMETHICONE 80 MG PO CHEW: 80.0000 mg | CHEWABLE_TABLET | ORAL | Status: DC | PRN

## 2012-09-09 MED ORDER — WITCH HAZEL-GLYCERIN EX PADS: 1.0000 "application " | MEDICATED_PAD | CUTANEOUS | Status: DC | PRN

## 2012-09-09 MED ORDER — ONDANSETRON HCL 4 MG/2ML IJ SOLN: 4.0000 mg | Freq: Three times a day (TID) | INTRAMUSCULAR | Status: DC | PRN

## 2012-09-09 MED ORDER — NALOXONE HCL 0.4 MG/ML IJ SOLN
1.0000 ug/kg/h | INTRAMUSCULAR | Status: DC | PRN
  Filled 2012-09-09: qty 2.5

## 2012-09-09 MED ORDER — DIPHENHYDRAMINE HCL 50 MG/ML IJ SOLN: 12.5000 mg | INTRAMUSCULAR | Status: DC | PRN

## 2012-09-09 MED ORDER — ONDANSETRON HCL 4 MG PO TABS: 4.0000 mg | ORAL_TABLET | ORAL | Status: DC | PRN

## 2012-09-09 MED ORDER — TETANUS-DIPHTH-ACELL PERTUSSIS 5-2.5-18.5 LF-MCG/0.5 IM SUSP: 0.5000 mL | Freq: Once | INTRAMUSCULAR | Status: DC

## 2012-09-09 MED ORDER — MENTHOL 3 MG MT LOZG: 1.0000 | LOZENGE | OROMUCOSAL | Status: DC | PRN

## 2012-09-09 MED ORDER — SENNOSIDES-DOCUSATE SODIUM 8.6-50 MG PO TABS
2.0000 | ORAL_TABLET | Freq: Every day | ORAL | Status: DC
  Administered 2012-09-09 – 2012-09-10 (×2): 2 via ORAL

## 2012-09-09 MED ORDER — LANOLIN HYDROUS EX OINT: 1.0000 "application " | TOPICAL_OINTMENT | CUTANEOUS | Status: DC | PRN

## 2012-09-09 MED ORDER — NALBUPHINE SYRINGE 5 MG/0.5 ML
5.0000 mg | INJECTION | INTRAMUSCULAR | Status: DC | PRN
  Filled 2012-09-09: qty 1

## 2012-09-09 MED ORDER — ACETAMINOPHEN 10 MG/ML IV SOLN
1000.0000 mg | Freq: Four times a day (QID) | INTRAVENOUS | Status: DC
  Administered 2012-09-09: 1000 mg via INTRAVENOUS
  Filled 2012-09-09 (×3): qty 100

## 2012-09-09 MED ORDER — NALOXONE HCL 0.4 MG/ML IJ SOLN: 0.4000 mg | INTRAMUSCULAR | Status: DC | PRN

## 2012-09-09 NOTE — Progress Notes (Signed)
Subjective: Postpartum Day 1: Cesarean Delivery Patient reports tolerating PO.    Objective: Vital signs in last 24 hours: Temp:  [97.8 F (36.6 C)-98.8 F (37.1 C)] 97.8 F (36.6 C) (10/08 [id number]) Pulse Rate:  [64-141] 86  (10/08 0625) Resp:  [16-24] 16  (10/08 0613) BP: (82-129)/(41-91) 119/76 mmHg (10/08 0625) SpO2:  [95 %-100 %] 97 % (10/08 0613) Weight:  [89.812 kg (198 lb)] 89.812 kg (198 lb) (10/07 0815)  Physical Exam:  General: alert and cooperative Lochia: appropriate Uterine Fundus: firm Incision: abd dressing CDI DVT Evaluation: No evidence of DVT seen on physical exam. Negative Homan's sign.   Basename 09/09/12 0525 09/08/12 2301  HGB 9.2* 9.5*  HCT 27.2* 28.2*    Assessment/Plan: Status post Cesarean section. Doing well postoperatively.  DC IV acetaminophen and Motrin.  Khiara Shuping G 09/09/2012, 8:02 AM

## 2012-09-09 NOTE — Anesthesia Postprocedure Evaluation (Signed)
  Anesthesia Post-op Note  Patient: Jackie Wilson  Procedure(s) Performed: Procedure(s) (LRB) with comments: CESAREAN SECTION (N/A)  Patient Location: Mother/Baby  Anesthesia Type: Epidural  Level of Consciousness: awake, alert  and oriented  Airway and Oxygen Therapy: Patient Spontanous Breathing  Post-op Pain: none  Post-op Assessment: Patient's Cardiovascular Status Stable, Respiratory Function Stable, Patent Airway, No signs of Nausea or vomiting, Pain level controlled, No headache, No backache, No residual numbness and No residual motor weakness  Post-op Vital Signs: Reviewed and stable  Complications: No apparent anesthesia complications

## 2012-09-09 NOTE — Addendum Note (Signed)
Addendum  created 09/09/12 0019 by Assunta Gambles, MD   Modules edited:Orders, PRL Based Order Sets

## 2012-09-09 NOTE — Progress Notes (Signed)
UR chart review completed.  

## 2012-09-09 NOTE — Progress Notes (Signed)
Patient c/o pain 5-6/10 around incision. Per report from PACU RN, no pain medication given since OR. Dr. Primitivo Gauze notified of patient's request for pain medication. Dr. Primitivo Gauze to put in orders for patient. Will continue to monitor.

## 2012-09-09 NOTE — Addendum Note (Signed)
Addendum  created 09/09/12 1049 by Tobin Chad, CRNA   Modules edited:Notes Section

## 2012-09-10 NOTE — Progress Notes (Signed)
Subjective: Postpartum Day 2: Cesarean Delivery Patient reports tolerating PO and no problems voiding.    Objective: Vital signs in last 24 hours: Temp:  [97.4 F (36.3 C)-98.3 F (36.8 C)] 97.4 F (36.3 C) (10/09 0609) Pulse Rate:  [69-89] 88  (10/09 0609) Resp:  [18-20] 18  (10/09 0609) BP: (109-130)/(68-83) 116/79 mmHg (10/09 0609) SpO2:  [98 %-99 %] 99 % (10/08 2050)  Physical Exam:  General: alert and cooperative Lochia: appropriate Uterine Fundus: firm Incision: healing well, staples intact DVT Evaluation: No evidence of DVT seen on physical exam. Negative Homan's sign.   Basename 09/09/12 0525 09/08/12 2301  HGB 9.2* 9.5*  HCT 27.2* 28.2*    Assessment/Plan: Status post Cesarean section. Doing well postoperatively.  Continue current care Increase warm beverages and ambulation.  Eyvonne Burchfield G 09/10/2012, 7:50 AM

## 2012-09-11 ENCOUNTER — Encounter (HOSPITAL_COMMUNITY)
Admission: RE | Admit: 2012-09-11 | Discharge: 2012-09-11 | Disposition: A | Discharge status: Home / Self Care | Payer: Medicare Other | Source: Ambulatory Visit | Attending: Obstetrics and Gynecology | Admitting: Obstetrics and Gynecology

## 2012-09-11 DIAGNOSIS — O923 Agalactia: Secondary | ICD-10-CM | POA: Insufficient documentation

## 2012-09-11 LAB — TYPE AND SCREEN
ABO/RH(D): A POS
Unit division: 0

## 2012-09-11 MED ORDER — OXYCODONE-ACETAMINOPHEN 5-325 MG PO TABS: 1.0000 | ORAL_TABLET | ORAL | Status: DC | PRN

## 2012-09-11 NOTE — Discharge Summary (Signed)
Obstetric Discharge Summary Reason for Admission: induction of labor Prenatal Procedures: NST and ultrasound Intrapartum Procedures: cesarean: low cervical, transverse Postpartum Procedures: none Complications-Operative and Postpartum: none Hemoglobin  Date Value Range Status  09/09/2012 9.2* 12.0 - 15.0 g/dL Final     HCT  Date Value Range Status  09/09/2012 27.2* 36.0 - 46.0 % Final    Physical Exam:  General: alert and cooperative Lochia: appropriate Uterine Fundus: firm Incision: healing well, staples removed DVT Evaluation: No evidence of DVT seen on physical exam. Negative Homan's sign. No cords or calf tenderness.  Discharge Diagnoses: Term Pregnancy-delivered  Discharge Information: Date: 09/11/2012 Activity: pelvic rest Diet: routine Medications: PNV and Percocet Condition: stable Instructions: refer to practice specific booklet Discharge to: home   Newborn Data:   Jackie, Wilson [id number]  Live born female  Birth Weight: 5 lb 2.2 oz (2330 g) APGAR: 9, 9   Jackie, Wilson [id number]  Live born female  Birth Weight: 5 lb 6.4 oz (2450 g) APGAR: 9, 9  Home with mother.  Jackie Wilson G 09/11/2012, 8:10 AM

## 2012-09-16 ENCOUNTER — Ambulatory Visit (HOSPITAL_COMMUNITY)
Admission: RE | Admit: 2012-09-16 | Discharge: 2012-09-16 | Disposition: A | Discharge status: Home / Self Care | Payer: BC Managed Care – PPO | Source: Ambulatory Visit | Attending: Obstetrics and Gynecology | Admitting: Obstetrics and Gynecology

## 2012-09-16 NOTE — Progress Notes (Signed)
Infant Lactation Consultation Outpatient Visit Note  Patient Name: Jackie Wilson Date of Birth: [birth year] Birth Weight:   Gestational Age at Delivery: Gestational Age: <None> Type of Delivery:  30 Toye Rouillard twins  Breastfeeding History Frequency of Breastfeeding: Has been putting babies to the breast and then bottle feeding EM and formula Length of Feeding:  Voids:  Stools:   Supplementing / Method: Pumping:  Type of Pump:   Frequency:  Volume:    Comments:    Consultation Evaluation:  Initial Feeding Assessment: Baby [name] Pre-feed Weight: 5-1.8  2320g Post-feed Weight: 5- 3.3  2360g Amount Transferred: 40 cc's Comments: [name] nursed for 18 minutes on the right breast. Mom is doing great with positioning baby. Lots of swallows heard. Baby came off the breast and appeared satisfied.   Additional Feeding Assessment: Baby [name] Pre-feed Weight: 5- 3.5  2366g Post-feed Weight: 5- 4.6  2398g Amount Transferred: 28 cc's Comments:[name] nursed for 25 minutes- was not as vigorous as Lanelle Bal but he had had 55 cc's of EBM and formula after last nursing 2 1/2 hours ago. Came off the breast and off to sleep.   Additional Feeding Assessment: Pre-feed Weight: Post-feed Weight: Amount Transferred: Comments:  Total Breast milk Transferred this Visit: [name]  40 cc's/ [name] 28 cc's Total Supplement Given:   Additional Interventions: Mom has been pumping for 20 minutes before nursing to erect nipple and obtain EBM for supplementing after nursing. Suggested only pumping for 5 minutes before nursing if babies are having a hard time getting latched, then nurse babies and pump after nursing for 15 minutes. No further questions at this time. To call prn  Follow-Up With ped To call for OP appointment here if needed.     Truddie Crumble 09/16/2012, 2:54 PM

## 2012-10-12 ENCOUNTER — Encounter (HOSPITAL_COMMUNITY)
Admission: RE | Admit: 2012-10-12 | Discharge: 2012-10-12 | Disposition: A | Discharge status: Home / Self Care | Payer: BC Managed Care – PPO | Source: Ambulatory Visit | Attending: Obstetrics and Gynecology | Admitting: Obstetrics and Gynecology

## 2012-10-12 DIAGNOSIS — O923 Agalactia: Secondary | ICD-10-CM | POA: Insufficient documentation

## 2012-11-11 ENCOUNTER — Encounter (HOSPITAL_COMMUNITY)
Admission: RE | Admit: 2012-11-11 | Discharge: 2012-11-11 | Disposition: A | Discharge status: Home / Self Care | Payer: Medicare Other | Source: Ambulatory Visit | Attending: Obstetrics and Gynecology | Admitting: Obstetrics and Gynecology

## 2012-11-11 DIAGNOSIS — O923 Agalactia: Secondary | ICD-10-CM | POA: Insufficient documentation

## 2012-12-12 ENCOUNTER — Encounter (HOSPITAL_COMMUNITY)
Admission: RE | Admit: 2012-12-12 | Discharge: 2012-12-12 | Disposition: A | Discharge status: Home / Self Care | Payer: Medicare Other | Source: Ambulatory Visit | Attending: Obstetrics and Gynecology | Admitting: Obstetrics and Gynecology

## 2012-12-12 DIAGNOSIS — O923 Agalactia: Secondary | ICD-10-CM | POA: Insufficient documentation

## 2013-11-23 ENCOUNTER — Encounter: Payer: Self-pay | Admitting: Internal Medicine

## 2013-11-24 ENCOUNTER — Encounter: Payer: Self-pay | Admitting: Internal Medicine

## 2013-11-24 ENCOUNTER — Ambulatory Visit (INDEPENDENT_AMBULATORY_CARE_PROVIDER_SITE_OTHER): Payer: BC Managed Care – PPO | Admitting: Internal Medicine

## 2013-11-24 VITALS — BP 102/64 | HR 94 | Temp 98.1°F | Resp 16 | Ht 63.25 in | Wt 161.4 lb

## 2013-11-24 DIAGNOSIS — N183 Chronic kidney disease, stage 3 unspecified: Secondary | ICD-10-CM

## 2013-11-24 DIAGNOSIS — Z23 Encounter for immunization: Secondary | ICD-10-CM

## 2013-11-24 DIAGNOSIS — R740 Nonspecific elevation of levels of transaminase and lactic acid dehydrogenase [LDH]: Secondary | ICD-10-CM

## 2013-11-24 DIAGNOSIS — Z79899 Other long term (current) drug therapy: Secondary | ICD-10-CM

## 2013-11-24 DIAGNOSIS — E559 Vitamin D deficiency, unspecified: Secondary | ICD-10-CM

## 2013-11-24 DIAGNOSIS — Z113 Encounter for screening for infections with a predominantly sexual mode of transmission: Secondary | ICD-10-CM

## 2013-11-24 DIAGNOSIS — Z0000 Encounter for general adult medical examination without abnormal findings: Secondary | ICD-10-CM

## 2013-11-24 DIAGNOSIS — R7402 Elevation of levels of lactic acid dehydrogenase (LDH): Secondary | ICD-10-CM

## 2013-11-24 DIAGNOSIS — Z1212 Encounter for screening for malignant neoplasm of rectum: Secondary | ICD-10-CM

## 2013-11-24 DIAGNOSIS — Z111 Encounter for screening for respiratory tuberculosis: Secondary | ICD-10-CM

## 2013-11-24 LAB — CBC WITH DIFFERENTIAL/PLATELET
Basophils Absolute: 0 10*3/uL (ref 0.0–0.1)
Eosinophils Absolute: 0.2 10*3/uL (ref 0.0–0.7)
Eosinophils Relative: 2 % (ref 0–5)
HCT: 40.3 % (ref 36.0–46.0)
Lymphocytes Relative: 15 % (ref 12–46)
Lymphs Abs: 1.3 10*3/uL (ref 0.7–4.0)
MCH: 28.5 pg (ref 26.0–34.0)
MCV: 83.8 fL (ref 78.0–100.0)
Monocytes Absolute: 0.7 10*3/uL (ref 0.1–1.0)
Platelets: 230 10*3/uL (ref 150–400)
RDW: 13.7 % (ref 11.5–15.5)

## 2013-11-24 LAB — VITAMIN B12: Vitamin B-12: 772 pg/mL (ref 211–911)

## 2013-11-24 LAB — HEPATIC FUNCTION PANEL
ALT: 19 U/L (ref 0–35)
Albumin: 4.8 g/dL (ref 3.5–5.2)
Alkaline Phosphatase: 80 U/L (ref 39–117)
Indirect Bilirubin: 0.3 mg/dL (ref 0.0–0.9)
Total Protein: 7.6 g/dL (ref 6.0–8.3)

## 2013-11-24 LAB — BASIC METABOLIC PANEL WITH GFR
BUN: 34 mg/dL — ABNORMAL HIGH (ref 6–23)
CO2: 26 mEq/L (ref 19–32)
Calcium: 9.5 mg/dL (ref 8.4–10.5)
Chloride: 103 mEq/L (ref 96–112)
Creat: 1.58 mg/dL — ABNORMAL HIGH (ref 0.50–1.10)
Glucose, Bld: 76 mg/dL (ref 70–99)

## 2013-11-24 LAB — LIPID PANEL
LDL Cholesterol: 89 mg/dL (ref 0–99)
Triglycerides: 134 mg/dL (ref <150)
VLDL: 27 mg/dL (ref 0–40)

## 2013-11-24 LAB — HEMOGLOBIN A1C
Hgb A1c MFr Bld: 5.1 % (ref <5.7)
Mean Plasma Glucose: 100 mg/dL (ref <117)

## 2013-11-24 NOTE — Progress Notes (Signed)
Patient ID: Jackie Wilson, female   DOB: [birth year], 37 y.o.   MRN: [id number]   Annual Screening Comprehensive Examination   This very nice 37 y.o.female presents for complete physical.  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  HTN and proteinuria are controlled with low dose enalapril. She has been followed by Dr Hassell Done til his recent retirement and will continue Renal F/U with Dr Joelyn Oms. Patient is now 14 months post-partum and has a IT consultant of twins - Kevan Rosebush. Other problems include OCD which is apparently doing very well on citalopram.     Finally, patient has history of Vitamin D Deficiency with last vitamin D 46 in Dec 2013.  Med List       CAMILA PO  Take by mouth. Takes daily as directed     citalopram 40 MG tablet  Commonly known as:  CELEXA  Take 40 mg by mouth daily.     enalapril 2.5 MG tablet  Commonly known as:  VASOTEC  Take 2.5 mg by mouth daily.            No Known Allergies  Past Medical History  Diagnosis Date  . Osteosarcoma   . AMA (advanced maternal age) primigravida 58+   . Renal insufficiency     Secondary to chemotherapy  . Hypertension   . OCD (obsessive compulsive disorder)     Past Surgical History  Procedure Laterality Date  . Right leg replacement    . Leep    . No past surgeries    . Tumor removal      Right leg  . Cesarean section  09/08/2012    Procedure: CESAREAN SECTION;  Surgeon: Luz Lex, MD;  Location: [facility];  Service: Obstetrics;  Laterality: N/A;    Family History  Problem Relation Age of Onset  . Other Neg Hx   . Diabetes Father   . Hypertension Father     History   Social History  . Marital Status: Married    Spouse Name: N/A    Number of Children: Twins 79 months old  . Years of Education: 2sd grade teacher    Occupational History  . Not on file.   Social History Main Topics  .  Smoking status: Former Smoker    Quit date: 12/03/1996  . Smokeless tobacco: Never Used  . Alcohol Use: 1.0 oz/week    2 drink(s) per week  . Drug Use: No  . Sexual Activity: Not Currently    Birth Control/ Protection: None      ROS Constitutional: Denies fever, chills, weight loss/gain, headaches, insomnia, fatigue, night sweats, and change in appetite. Eyes: Denies redness, blurred vision, diplopia, discharge, itchy, watery eyes.  ENT: Denies discharge, congestion, post nasal drip, epistaxis, sore throat, earache, hearing loss, dental pain, Tinnitus, Vertigo, Sinus pain, snoring.  Cardio: Denies chest pain, palpitations, irregular heartbeat, syncope, dyspnea, diaphoresis, orthopnea, PND, claudication, edema Respiratory: denies cough, dyspnea, DOE, pleurisy, hoarseness, laryngitis, wheezing.  Gastrointestinal: Denies dysphagia, heartburn, reflux, water brash, pain, cramps, nausea, vomiting, bloating, diarrhea, constipation, hematemesis, melena, hematochezia, jaundice, hemorrhoids Genitourinary: Denies dysuria, frequency, urgency, nocturia, hesitancy, discharge, hematuria, flank pain Breast: Breast lumps, nipple discharge, bleeding.  Musculoskeletal: Denies arthralgia, myalgia, stiffness, Jt. Swelling, pain, limp, and strain/sprain. Skin: Denies puritis, rash, hives, warts, acne, eczema, changing in skin lesion Neuro: Weakness, tremor, incoordination, spasms, paresthesia, pain Psychiatric: Denies confusion, memory loss, sensory loss.  Endocrine: Denies change in weight, skin, hair change, nocturia, and paresthesia, diabetic polys, visual blurring, hyper /hypo glycemic episodes.  Heme/Lymph: No excessive bleeding, bruising, enlarged lymph nodes.  BP: 102/64  Pulse: 94  Temp: 98.1 F (36.7 C)  Resp: 16   Estimated body mass index is 28.35 kg/(m^2) as calculated from the following:   Height as of this encounter: 5' 3.25" (1.607 m).   Weight as of this encounter: 161 lb 6.4 oz (73.211  kg).  Physical Exam General Appearance: Well nourished, in no apparent distress. Eyes: PERRLA, EOMs, conjunctiva no swelling or erythema, normal fundi and vessels. Sinuses: No frontal/maxillary tenderness ENT/Mouth: EACs patent / TMs  nl. Nares clear without erythema, swelling, mucoid exudates. Oral hygiene is good. No erythema, swelling, or exudate. Tongue normal, non-obstructing. Tonsils not swollen or erythematous. Hearing normal.  Neck: Supple, thyroid normal. No bruits, nodes or JVD. Respiratory: Respiratory effort normal.  BS equal and clear bilateral without rales, rhonci, wheezing or stridor. Cardio: Heart sounds are normal with regular rate and rhythm and no murmurs, rubs or gallops. Peripheral pulses are normal and equal bilaterally without edema. No aortic or femoral bruits. Chest: symmetric with normal excursions and percussion. Breasts: Symmetric, without lumps, nipple discharge, retractions, or fibrocystic changes.  Abdomen: Flat, soft, with bowl sounds. Nontender, no guarding, rebound, hernias, masses, or organomegaly.  Lymphatics: Non tender without lymphadenopathy.  Genitourinary:  Musculoskeletal: Full ROM all peripheral extremities, joint stability, 5/5 strength, and normal gait. Skin: Warm and dry without rashes, lesions, cyanosis, clubbing or  ecchymosis.  Neuro: Cranial nerves intact, reflexes equal bilaterally. Normal muscle tone, no cerebellar symptoms. Sensation intact.  Pysch: Awake and oriented X 3, normal affect, Insight and Judgment appropriate.   Assessment and Plan  1. Annual Screening Examination 2. Hypertension  3. Chronic Renal Disease 4. OCD  Continue prudent diet as discussed, weight control, regular exercise, and medications. Routine screening labs and tests as requested with regular follow-up as recommended.

## 2013-11-25 LAB — HEPATITIS B SURFACE ANTIBODY,QUALITATIVE: Hep B S Ab: NEGATIVE

## 2013-11-25 LAB — INSULIN, FASTING: Insulin fasting, serum: 27 u[IU]/mL (ref 3–28)

## 2013-11-25 LAB — URINALYSIS, MICROSCOPIC ONLY
Bacteria, UA: NONE SEEN
Casts: NONE SEEN
Crystals: NONE SEEN
Squamous Epithelial / LPF: NONE SEEN

## 2013-11-25 LAB — HEPATITIS A ANTIBODY, TOTAL: Hep A Total Ab: NONREACTIVE

## 2013-11-25 LAB — HEPATITIS B CORE ANTIBODY, TOTAL: Hep B Core Total Ab: NONREACTIVE

## 2013-11-25 LAB — MICROALBUMIN / CREATININE URINE RATIO: Microalb Creat Ratio: 137.4 mg/g — ABNORMAL HIGH (ref 0.0–30.0)

## 2013-11-27 LAB — HEPATITIS B E ANTIBODY: Hepatitis Be Antibody: NEGATIVE

## 2014-02-03 ENCOUNTER — Other Ambulatory Visit: Payer: Self-pay | Admitting: Obstetrics and Gynecology

## 2014-02-03 DIAGNOSIS — R928 Other abnormal and inconclusive findings on diagnostic imaging of breast: Secondary | ICD-10-CM

## 2014-02-10 ENCOUNTER — Other Ambulatory Visit: Payer: Self-pay | Admitting: Internal Medicine

## 2014-02-17 NOTE — Telephone Encounter (Signed)
LMOM TO CALL  

## 2014-02-18 ENCOUNTER — Ambulatory Visit
Admission: RE | Admit: 2014-02-18 | Discharge: 2014-02-18 | Disposition: A | Discharge status: Home / Self Care | Payer: BC Managed Care – PPO | Source: Ambulatory Visit | Attending: Obstetrics and Gynecology | Admitting: Obstetrics and Gynecology

## 2014-02-18 DIAGNOSIS — R928 Other abnormal and inconclusive findings on diagnostic imaging of breast: Secondary | ICD-10-CM

## 2014-05-18 ENCOUNTER — Other Ambulatory Visit: Payer: Self-pay | Admitting: Emergency Medicine

## 2014-05-31 ENCOUNTER — Other Ambulatory Visit: Payer: Self-pay | Admitting: Emergency Medicine

## 2014-08-03 ENCOUNTER — Other Ambulatory Visit: Payer: Self-pay | Admitting: Obstetrics and Gynecology

## 2014-08-04 LAB — CYTOLOGY - PAP

## 2014-08-29 ENCOUNTER — Other Ambulatory Visit: Payer: Self-pay | Admitting: Physician Assistant

## 2014-10-04 ENCOUNTER — Encounter: Payer: Self-pay | Admitting: Internal Medicine

## 2014-10-14 DIAGNOSIS — C419 Malignant neoplasm of bone and articular cartilage, unspecified: Secondary | ICD-10-CM

## 2014-10-14 HISTORY — DX: Malignant neoplasm of bone and articular cartilage, unspecified: C41.9

## 2014-11-29 ENCOUNTER — Encounter: Payer: Self-pay | Admitting: Internal Medicine

## 2014-11-29 ENCOUNTER — Ambulatory Visit (INDEPENDENT_AMBULATORY_CARE_PROVIDER_SITE_OTHER): Payer: BC Managed Care – PPO | Admitting: Internal Medicine

## 2014-11-29 VITALS — BP 112/66 | HR 92 | Temp 97.9°F | Resp 16 | Ht 63.25 in | Wt 172.6 lb

## 2014-11-29 DIAGNOSIS — Z111 Encounter for screening for respiratory tuberculosis: Secondary | ICD-10-CM

## 2014-11-29 DIAGNOSIS — E559 Vitamin D deficiency, unspecified: Secondary | ICD-10-CM

## 2014-11-29 DIAGNOSIS — R5383 Other fatigue: Secondary | ICD-10-CM

## 2014-11-29 DIAGNOSIS — I150 Renovascular hypertension: Secondary | ICD-10-CM

## 2014-11-29 DIAGNOSIS — Z1212 Encounter for screening for malignant neoplasm of rectum: Secondary | ICD-10-CM

## 2014-11-29 DIAGNOSIS — E785 Hyperlipidemia, unspecified: Secondary | ICD-10-CM | POA: Insufficient documentation

## 2014-11-29 DIAGNOSIS — Z79899 Other long term (current) drug therapy: Secondary | ICD-10-CM | POA: Insufficient documentation

## 2014-11-29 DIAGNOSIS — I10 Essential (primary) hypertension: Secondary | ICD-10-CM | POA: Insufficient documentation

## 2014-11-29 DIAGNOSIS — R7309 Other abnormal glucose: Secondary | ICD-10-CM

## 2014-11-29 DIAGNOSIS — E669 Obesity, unspecified: Secondary | ICD-10-CM | POA: Insufficient documentation

## 2014-11-29 NOTE — Progress Notes (Signed)
Patient ID: Jackie Wilson, female   DOB: Apr 20, 1976, 38 y.o.   MRN: [id number]  Annual Comprehensive Examination  This very nice 38 y.o.MWF presents for complete physical.  Patient has been followed for HTN,Morbid Obesity, Hyperlipidemia, and Vitamin D Deficiency. Patient has a set of 38 yo boy/girl twins.    In 2000, patient was treated with surgery and Chemotherapy for Osteosarcoma and has resultant CKD attributed to her Chemotherapy and has been on an ACEi since for control of BP and microalbuminuria. She was off of her ACEi for her recent pregnancy 2-3 years ago.     HTN predates since 2002. Patient's BP has been controlled at home and patient denies any cardiac symptoms as chest pain, palpitations, shortness of breath, dizziness or ankle swelling. Today's BP: 112/66 mmHg    Patient's hyperlipidemia is controlled with diet and medications. Patient denies myalgias or other medication SE's. Last lipids were  Lab Results  Component Value Date   CHOL 162 11/24/2013   HDL 46 11/24/2013   LDLCALC 89 11/24/2013   TRIG 134 11/24/2013   CHOLHDL 3.5 11/24/2013    Patient has Morbid Obesity (BMI 30.32 ) and is monitored for prediabetes  and patient denies reactive hypoglycemic symptoms, visual blurring, diabetic polys, or paresthesias. Last A1c was Lab Results  Component Value Date   HGBA1C 5.1 11/24/2013    Finally, patient has history of Vitamin D Deficiency and last Vitamin D was very low at 25 in Dec 2014.  Medication Sig  . citalopram (CELEXA) 40 MG tablet TAKE 1 TABLET BY MOUTH EVERY DAY FOR MOOD DEPRESSION  . enalapril (VASOTEC) 2.5 MG tablet Take 2.5 mg by mouth daily.   No Known Allergies   Past Medical History  Diagnosis Date  . Osteosarcoma   . AMA (advanced maternal age) primigravida 2+   . Renal insufficiency     Secondary to chemotherapy  . Hypertension   . OCD (obsessive compulsive disorder)    Health Maintenance  Topic Date Due  . INFLUENZA VACCINE  07/03/2014  . PAP  SMEAR  08/03/2017  . TETANUS/TDAP  07/11/2022   Immunization History  Administered Date(s) Administered  . PPD Test 11/24/2013  . Pneumococcal Polysaccharide-23 11/24/2013  . Tdap 07/11/2012   Past Surgical History  Procedure Laterality Date  . Right leg replacement    . Leep    . No past surgeries    . Tumor removal      Right leg  . Cesarean section  09/08/2012    Procedure: CESAREAN SECTION;  Surgeon: Luz Lex, MD;  Location: [facility];  Service: Obstetrics;  Laterality: N/A;   Family History  Problem Relation Age of Onset  . Other Neg Hx   . Diabetes Father   . Hypertension Father    History  Substance Use Topics  . Smoking status: Former Smoker    Quit date: 12/03/1996  . Smokeless tobacco: Never Used  . Alcohol Use: 1.0 oz/week    2 drink(s) per week    ROS Constitutional: Denies fever, chills, weight loss/gain, headaches, insomnia, fatigue, night sweats, and change in appetite. Eyes: Denies redness, blurred vision, diplopia, discharge, itchy, watery eyes.  ENT: Denies discharge, congestion, post nasal drip, epistaxis, sore throat, earache, hearing loss, dental pain, Tinnitus, Vertigo, Sinus pain, snoring.  Cardio: Denies chest pain, palpitations, irregular heartbeat, syncope, dyspnea, diaphoresis, orthopnea, PND, claudication, edema Respiratory: denies cough, dyspnea, DOE, pleurisy, hoarseness, laryngitis, wheezing.  Gastrointestinal: Denies dysphagia, heartburn, reflux, water brash, pain, cramps, nausea, vomiting, bloating,  diarrhea, constipation, hematemesis, melena, hematochezia, jaundice, hemorrhoids Genitourinary: Denies dysuria, frequency, urgency, nocturia, hesitancy, discharge, hematuria, flank pain Breast: Breast lumps, nipple discharge, bleeding.  Musculoskeletal: Denies arthralgia, myalgia, stiffness, Jt. Swelling, pain, limp, and strain/sprain. Denies falls. Skin: Denies puritis, rash, hives, warts, acne, eczema, changing in skin lesion Neuro: No  weakness, tremor, incoordination, spasms, paresthesia, pain Psychiatric: Denies confusion, memory loss, sensory loss. Denies Depression. Endocrine: Denies change in weight, skin, hair change, nocturia, and paresthesia, diabetic polys, visual blurring, hyper / hypo glycemic episodes.  Heme/Lymph: No excessive bleeding, bruising, enlarged lymph nodes.  Physical Exam  BP 112/66   Pulse 92  Temp 97.9 F  Resp 16  Ht 5' 3.25"  Wt 172 lb 9.6 oz     BMI 30.32       LMP 11/06/2014   General Appearance: Well nourished and in no apparent distress. Eyes: PERRLA, EOMs, conjunctiva no swelling or erythema, normal fundi and vessels. Sinuses: No frontal/maxillary tenderness ENT/Mouth: EACs patent / TMs  nl. Nares clear without erythema, swelling, mucoid exudates. Oral hygiene is good. No erythema, swelling, or exudate. Tongue normal, non-obstructing. Tonsils not swollen or erythematous. Hearing normal.  Neck: Supple, thyroid normal. No bruits, nodes or JVD. Respiratory: Respiratory effort normal.  BS equal and clear bilateral without rales, rhonci, wheezing or stridor. Cardio: Heart sounds are normal with regular rate and rhythm and no murmurs, rubs or gallops. Peripheral pulses are normal and equal bilaterally without edema. No aortic or femoral bruits. Chest: symmetric with normal excursions and percussion. Breasts: Deferred to GYN  Abdomen: Flat, soft, with bowl sounds. Nontender, no guarding, rebound, hernias, masses, or organomegaly.  Lymphatics: Non tender without lymphadenopathy.  Musculoskeletal: Right mid Femur to mid tibial vertical with moderate loss of muscle mass due to previous surgery. Full ROM all peripheral extremities, joint stability, 5/5 strength, and normal gait. Skin: Warm and dry without rashes, lesions, cyanosis, clubbing or  ecchymosis.  Neuro: Cranial nerves intact, reflexes equal bilaterally. Normal muscle tone, no cerebellar symptoms. Sensation intact.  Pysch: Awake and  oriented X 3, normal affect, Insight and Judgment appropriate.   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 Hyperlipidemia  - Lipid panel  3. Abnormal glucose  - Hemoglobin A1c - Insulin, fasting  4. Vitamin D deficiency  - Vit D  25 hydroxy (rtn osteoporosis monitoring)  5. Screening for rectal cancer  - POC Hemoccult Bld/Stl (3-Cd Home Screen); Future  6. Other fatigue  - Vitamin B12 - Iron and TIBC  7. Medication management  - Urine Microscopic - CBC with Differential - BASIC METABOLIC PANEL WITH GFR - Hepatic function panel - Magnesium  8. Morbid obesity (BMI 30.32)   9. Screening examination for pulmonary tuberculosis  - PPD  10. S/P Surg & Chemotx for Osteosarcoma of RLE   Continue prudent diet as discussed, weight control, BP monitoring, regular exercise, and medications. Discussed med's effects and SE's. Screening labs and tests as requested with regular follow-up as recommended.

## 2014-11-29 NOTE — Patient Instructions (Signed)
Recommend the book "The END of DIETING" by Dr Baker Janus   and the book "The END of DIABETES " by Dr Excell Seltzer  At [email or web address] - get book & Audio CD's      Being diabetic has a  300% increased risk for heart attack, stroke, cancer, and alzheimer- type vascular dementia. It is very important that you work harder with diet by avoiding all foods that are white except chicken & fish. Avoid white rice (brown & wild rice is OK), white potatoes (sweetpotatoes in moderation is OK), White bread or wheat bread or anything made out of white flour like bagels, donuts, rolls, buns, biscuits, cakes, pastries, cookies, pizza crust, and pasta (made from white flour & egg whites) - vegetarian pasta or spinach or wheat pasta is OK. Multigrain breads like Arnold's or [employer], or multigrain sandwich thins or flatbreads.  Diet, exercise and weight loss can reverse and cure diabetes in the early stages.  Diet, exercise and weight loss is very important in the control and prevention of complications of diabetes which affects every system in your body, ie. Brain - dementia/stroke, eyes - glaucoma/blindness, heart - heart attack/heart failure, kidneys - dialysis, stomach - gastric paralysis, intestines - malabsorption, nerves - severe painful neuritis, circulation - gangrene & loss of a leg(s), and finally cancer and Alzheimers.    I recommend avoid fried & greasy foods,  sweets/candy, white rice (brown or wild rice or Quinoa is OK), white potatoes (sweet potatoes are OK) - anything made from white flour - bagels, doughnuts, rolls, buns, biscuits,white and wheat breads, pizza crust and traditional pasta made of white flour & egg white(vegetarian pasta or spinach or wheat pasta is OK).  Multi-grain bread is OK - like multi-grain flat bread or sandwich thins. Avoid alcohol in excess. Exercise is also important.    Eat all the vegetables you want - avoid meat, especially red meat and dairy - especially cheese.  Cheese  is the most concentrated form of trans-fats which is the worst thing to clog up our arteries. Veggie cheese is OK which can be found in the fresh produce section at [employer] or [employer] or [employer] Preventive Care for Adults A healthy lifestyle and preventive care can promote health and wellness. Preventive health guidelines for women include the following key practices.  A routine yearly physical is a good way to check with your health care provider about your health and preventive screening. It is a chance to share any concerns and updates on your health and to receive a thorough exam.  Visit your dentist for a routine exam and preventive care every 6 months. Brush your teeth twice a day and floss once a day. Good oral hygiene prevents tooth decay and gum disease.  The frequency of eye exams is based on your age, health, family medical history, use of contact lenses, and other factors. Follow your health care provider's recommendations for frequency of eye exams.  Eat a healthy diet. Foods like vegetables, fruits, whole grains, low-fat dairy products, and lean protein foods contain the nutrients you need without too many calories. Decrease your intake of foods high in solid fats, added sugars, and salt. Eat the right amount of calories for you.Get information about a proper diet from your health care provider, if necessary.  Regular physical exercise is one of the most important things you can do for your health. Most adults should get at least 150 minutes of moderate-intensity exercise (any activity that increases your  heart rate and causes you to sweat) each week. In addition, most adults need muscle-strengthening exercises on 2 or more days a week.  Maintain a healthy weight. The body mass index (BMI) is a screening tool to identify possible weight problems. It provides an estimate of body fat based on height and weight. Your health care provider can find your BMI and can help you  achieve or maintain a healthy weight.For adults 20 years and older:  A BMI below 18.5 is considered underweight.  A BMI of 18.5 to 24.9 is normal.  A BMI of 25 to 29.9 is considered overweight.  A BMI of 30 and above is considered obese.  Maintain normal blood lipids and cholesterol levels by exercising and minimizing your intake of saturated fat. Eat a balanced diet with plenty of fruit and vegetables. Blood tests for lipids and cholesterol should begin at age 36 and be repeated every 5 years. If your lipid or cholesterol levels are high, you are over 50, or you are at high risk for heart disease, you may need your cholesterol levels checked more frequently.Ongoing high lipid and cholesterol levels should be treated with medicines if diet and exercise are not working.  If you smoke, find out from your health care provider how to quit. If you do not use tobacco, do not start.  Lung cancer screening is recommended for adults aged 72-80 years who are at high risk for developing lung cancer because of a history of smoking. A yearly low-dose CT scan of the lungs is recommended for people who have at least a 30-pack-year history of smoking and are a current smoker or have quit within the past 15 years. A pack year of smoking is smoking an average of 1 pack of cigarettes a day for 1 year (for example: 1 pack a day for 30 years or 2 packs a day for 15 years). Yearly screening should continue until the smoker has stopped smoking for at least 15 years. Yearly screening should be stopped for people who develop a health problem that would prevent them from having lung cancer treatment.  If you are pregnant, do not drink alcohol. If you are breastfeeding, be very cautious about drinking alcohol. If you are not pregnant and choose to drink alcohol, do not have more than 1 drink per day. One drink is considered to be 12 ounces (355 mL) of beer, 5 ounces (148 mL) of wine, or 1.5 ounces (44 mL) of liquor.  Avoid  use of street drugs. Do not share needles with anyone. Ask for help if you need support or instructions about stopping the use of drugs.  High blood pressure causes heart disease and increases the risk of stroke. Your blood pressure should be checked at least every 1 to 2 years. Ongoing high blood pressure should be treated with medicines if weight loss and exercise do not work.  If you are 51-55 years old, ask your health care provider if you should take aspirin to prevent strokes.  Diabetes screening involves taking a blood sample to check your fasting blood sugar level. This should be done once every 3 years, after age 17, if you are within normal weight and without risk factors for diabetes. Testing should be considered at a younger age or be carried out more frequently if you are overweight and have at least 1 risk factor for diabetes.  Breast cancer screening is essential preventive care for women. You should practice "breast self-awareness." This means understanding the normal  appearance and feel of your breasts and may include breast self-examination. Any changes detected, no matter how small, should be reported to a health care provider. Women in their 75s and 30s should have a clinical breast exam (CBE) by a health care provider as part of a regular health exam every 1 to 3 years. After age 49, women should have a CBE every year. Starting at age 17, women should consider having a mammogram (breast X-ray test) every year. Women who have a family history of breast cancer should talk to their health care provider about genetic screening. Women at a high risk of breast cancer should talk to their health care providers about having an MRI and a mammogram every year.  Breast cancer gene (BRCA)-related cancer risk assessment is recommended for women who have family members with BRCA-related cancers. BRCA-related cancers include breast, ovarian, tubal, and peritoneal cancers. Having family members with  these cancers may be associated with an increased risk for harmful changes (mutations) in the breast cancer genes BRCA1 and BRCA2. Results of the assessment will determine the need for genetic counseling and BRCA1 and BRCA2 testing.  Routine pelvic exams to screen for cancer are no longer recommended for nonpregnant women who are considered low risk for cancer of the pelvic organs (ovaries, uterus, and vagina) and who do not have symptoms. Ask your health care provider if a screening pelvic exam is right for you.  If you have had past treatment for cervical cancer or a condition that could lead to cancer, you need Pap tests and screening for cancer for at least 20 years after your treatment. If Pap tests have been discontinued, your risk factors (such as having a new sexual partner) need to be reassessed to determine if screening should be resumed. Some women have medical problems that increase the chance of getting cervical cancer. In these cases, your health care provider may recommend more frequent screening and Pap tests.  The HPV test is an additional test that may be used for cervical cancer screening. The HPV test looks for the virus that can cause the cell changes on the cervix. The cells collected during the Pap test can be tested for HPV. The HPV test could be used to screen women aged 85 years and older, and should be used in women of any age who have unclear Pap test results. After the age of 64, women should have HPV testing at the same frequency as a Pap test.  Colorectal cancer can be detected and often prevented. Most routine colorectal cancer screening begins at the age of 74 years and continues through age 67 years. However, your health care provider may recommend screening at an earlier age if you have risk factors for colon cancer. On a yearly basis, your health care provider may provide home test kits to check for hidden blood in the stool. Use of a small camera at the end of a tube, to  directly examine the colon (sigmoidoscopy or colonoscopy), can detect the earliest forms of colorectal cancer. Talk to your health care provider about this at age 22, when routine screening begins. Direct exam of the colon should be repeated every 5-10 years through age 83 years, unless early forms of pre-cancerous polyps or small growths are found.  People who are at an increased risk for hepatitis B should be screened for this virus. You are considered at high risk for hepatitis B if:  You were born in a country where hepatitis B occurs often.  Talk with your health care provider about which countries are considered high risk.  Your parents were born in a high-risk country and you have not received a shot to protect against hepatitis B (hepatitis B vaccine).  You have HIV or AIDS.  You use needles to inject street drugs.  You live with, or have sex with, someone who has hepatitis B.  You get hemodialysis treatment.  You take certain medicines for conditions like cancer, organ transplantation, and autoimmune conditions.  Hepatitis C blood testing is recommended for all people born from [birth year] through 1965 and any individual with known risks for hepatitis C.  Practice safe sex. Use condoms and avoid high-risk sexual practices to reduce the spread of sexually transmitted infections (STIs). STIs include gonorrhea, chlamydia, syphilis, trichomonas, herpes, HPV, and human immunodeficiency virus (HIV). Herpes, HIV, and HPV are viral illnesses that have no cure. They can result in disability, cancer, and death.  You should be screened for sexually transmitted illnesses (STIs) including gonorrhea and chlamydia if:  You are sexually active and are younger than 24 years.  You are older than 24 years and your health care provider tells you that you are at risk for this type of infection.  Your sexual activity has changed since you were last screened and you are at an increased risk for chlamydia or  gonorrhea. Ask your health care provider if you are at risk.  If you are at risk of being infected with HIV, it is recommended that you take a prescription medicine daily to prevent HIV infection. This is called preexposure prophylaxis (PrEP). You are considered at risk if:  You are a heterosexual woman, are sexually active, and are at increased risk for HIV infection.  You take drugs by injection.  You are sexually active with a partner who has HIV.  Talk with your health care provider about whether you are at high risk of being infected with HIV. If you choose to begin PrEP, you should first be tested for HIV. You should then be tested every 3 months for as long as you are taking PrEP.  Osteoporosis is a disease in which the bones lose minerals and strength with aging. This can result in serious bone fractures or breaks. The risk of osteoporosis can be identified using a bone density scan. Women ages 32 years and over and women at risk for fractures or osteoporosis should discuss screening with their health care providers. Ask your health care provider whether you should take a calcium supplement or vitamin D to reduce the rate of osteoporosis.  Menopause can be associated with physical symptoms and risks. Hormone replacement therapy is available to decrease symptoms and risks. You should talk to your health care provider about whether hormone replacement therapy is right for you.  Use sunscreen. Apply sunscreen liberally and repeatedly throughout the day. You should seek shade when your shadow is shorter than you. Protect yourself by wearing long sleeves, pants, a wide-brimmed hat, and sunglasses year round, whenever you are outdoors.  Once a month, do a whole body skin exam, using a mirror to look at the skin on your back. Tell your health care provider of new moles, moles that have irregular borders, moles that are larger than a pencil eraser, or moles that have changed in shape or  color.  Stay current with required vaccines (immunizations).  Influenza vaccine. All adults should be immunized every year.  Tetanus, diphtheria, and acellular pertussis (Td, Tdap) vaccine. Pregnant women should receive 1  dose of Tdap vaccine during each pregnancy. The dose should be obtained regardless of the length of time since the last dose. Immunization is preferred during the 27th-36th week of gestation. An adult who has not previously received Tdap or who does not know her vaccine status should receive 1 dose of Tdap. This initial dose should be followed by tetanus and diphtheria toxoids (Td) booster doses every 10 years. Adults with an unknown or incomplete history of completing a 3-dose immunization series with Td-containing vaccines should begin or complete a primary immunization series including a Tdap dose. Adults should receive a Td booster every 10 years.  Varicella vaccine. An adult without evidence of immunity to varicella should receive 2 doses or a second dose if she has previously received 1 dose. Pregnant females who do not have evidence of immunity should receive the first dose after pregnancy. This first dose should be obtained before leaving the health care facility. The second dose should be obtained 4-8 weeks after the first dose.  Human papillomavirus (HPV) vaccine. Females aged 13-26 years who have not received the vaccine previously should obtain the 3-dose series. The vaccine is not recommended for use in pregnant females. However, pregnancy testing is not needed before receiving a dose. If a female is found to be pregnant after receiving a dose, no treatment is needed. In that case, the remaining doses should be delayed until after the pregnancy. Immunization is recommended for any person with an immunocompromised condition through the age of 15 years if she did not get any or all doses earlier. During the 3-dose series, the second dose should be obtained 4-8 weeks after the  first dose. The third dose should be obtained 24 weeks after the first dose and 16 weeks after the second dose.  Zoster vaccine. One dose is recommended for adults aged 73 years or older unless certain conditions are present.  Measles, mumps, and rubella (MMR) vaccine. Adults born before 40 generally are considered immune to measles and mumps. Adults born in [birth year] or later should have 1 or more doses of MMR vaccine unless there is a contraindication to the vaccine or there is laboratory evidence of immunity to each of the three diseases. A routine second dose of MMR vaccine should be obtained at least 28 days after the first dose for students attending postsecondary schools, health care workers, or international travelers. People who received inactivated measles vaccine or an unknown type of measles vaccine during 1963-1967 should receive 2 doses of MMR vaccine. People who received inactivated mumps vaccine or an unknown type of mumps vaccine before 1979 and are at high risk for mumps infection should consider immunization with 2 doses of MMR vaccine. For females of childbearing age, rubella immunity should be determined. If there is no evidence of immunity, females who are not pregnant should be vaccinated. If there is no evidence of immunity, females who are pregnant should delay immunization until after pregnancy. Unvaccinated health care workers born before [birth year] who lack laboratory evidence of measles, mumps, or rubella immunity or laboratory confirmation of disease should consider measles and mumps immunization with 2 doses of MMR vaccine or rubella immunization with 1 dose of MMR vaccine.  Pneumococcal 13-valent conjugate (PCV13) vaccine. When indicated, a person who is uncertain of her immunization history and has no record of immunization should receive the PCV13 vaccine. An adult aged 71 years or older who has certain medical conditions and has not been previously immunized should receive 1 dose of  PCV13  vaccine. This PCV13 should be followed with a dose of pneumococcal polysaccharide (PPSV23) vaccine. The PPSV23 vaccine dose should be obtained at least 8 weeks after the dose of PCV13 vaccine. An adult aged 48 years or older who has certain medical conditions and previously received 1 or more doses of PPSV23 vaccine should receive 1 dose of PCV13. The PCV13 vaccine dose should be obtained 1 or more years after the last PPSV23 vaccine dose.  Pneumococcal polysaccharide (PPSV23) vaccine. When PCV13 is also indicated, PCV13 should be obtained first. All adults aged 47 years and older should be immunized. An adult younger than age 19 years who has certain medical conditions should be immunized. Any person who resides in a nursing home or long-term care facility should be immunized. An adult smoker should be immunized. People with an immunocompromised condition and certain other conditions should receive both PCV13 and PPSV23 vaccines. People with human immunodeficiency virus (HIV) infection should be immunized as soon as possible after diagnosis. Immunization during chemotherapy or radiation therapy should be avoided. Routine use of PPSV23 vaccine is not recommended for American Indians, [place]atives, or people younger than 65 years unless there are medical conditions that require PPSV23 vaccine. When indicated, people who have unknown immunization and have no record of immunization should receive PPSV23 vaccine. One-time revaccination 5 years after the first dose of PPSV23 is recommended for people aged 19-64 years who have chronic kidney failure, nephrotic syndrome, asplenia, or immunocompromised conditions. People who received 1-2 doses of PPSV23 before age 86 years should receive another dose of PPSV23 vaccine at age 1 years or later if at least 5 years have passed since the previous dose. Doses of PPSV23 are not needed for people immunized with PPSV23 at or after age 83 years.  Meningococcal vaccine.  Adults with asplenia or persistent complement component deficiencies should receive 2 doses of quadrivalent meningococcal conjugate (MenACWY-D) vaccine. The doses should be obtained at least 2 months apart. Microbiologists working with certain meningococcal bacteria, [employer] recruits, people at risk during an outbreak, and people who travel to or live in countries with a high rate of meningitis should be immunized. A first-year college student up through age 76 years who is living in a residence hall should receive a dose if she did not receive a dose on or after her 16th birthday. Adults who have certain high-risk conditions should receive one or more doses of vaccine.  Hepatitis A vaccine. Adults who wish to be protected from this disease, have certain high-risk conditions, work with hepatitis A-infected animals, work in hepatitis A research labs, or travel to or work in countries with a high rate of hepatitis A should be immunized. Adults who were previously unvaccinated and who anticipate close contact with an international adoptee during the first 60 days after arrival in the [place] from a country with a high rate of hepatitis A should be immunized.  Hepatitis B vaccine. Adults who wish to be protected from this disease, have certain high-risk conditions, may be exposed to blood or other infectious body fluids, are household contacts or sex partners of hepatitis B positive people, are clients or workers in certain care facilities, or travel to or work in countries with a high rate of hepatitis B should be immunized.  Haemophilus influenzae type b (Hib) vaccine. A previously unvaccinated person with asplenia or sickle cell disease or having a scheduled splenectomy should receive 1 dose of Hib vaccine. Regardless of previous immunization, a recipient of a hematopoietic stem cell  transplant should receive a 3-dose series 6-12 months after her successful transplant. Hib vaccine is not recommended for  adults with HIV infection. Preventive Services / Frequency  Ages 46 to 77 years  Blood pressure check.  Lipid and cholesterol check.  Clinical breast exam.** / Every 3 years for women in their 74s and 59s.  BRCA-related cancer risk assessment.** / For women who have family members with a BRCA-related cancer (breast, ovarian, tubal, or peritoneal cancers).  Pap test.** / Every 2 years from ages 62 through 53. Every 3 years starting at age 43 through age 24 or 35 with a history of 3 consecutive normal Pap tests.  HPV screening.** / Every 3 years from ages 92 through ages 65 to 19 with a history of 3 consecutive normal Pap tests.  Hepatitis C blood test.** / For any individual with known risks for hepatitis C.  Skin self-exam. / Monthly.  Influenza vaccine. / Every year.  Tetanus, diphtheria, and acellular pertussis (Tdap, Td) vaccine.** / Consult your health care provider. Pregnant women should receive 1 dose of Tdap vaccine during each pregnancy. 1 dose of Td every 10 years.  Varicella vaccine.** / Consult your health care provider. Pregnant females who do not have evidence of immunity should receive the first dose after pregnancy.  HPV vaccine. / 3 doses over 6 months, if 31 and younger. The vaccine is not recommended for use in pregnant females. However, pregnancy testing is not needed before receiving a dose.  Measles, mumps, rubella (MMR) vaccine.** / You need at least 1 dose of MMR if you were born in 1957 or later. You may also need a 2nd dose. For females of childbearing age, rubella immunity should be determined. If there is no evidence of immunity, females who are not pregnant should be vaccinated. If there is no evidence of immunity, females who are pregnant should delay immunization until after pregnancy.  Pneumococcal 13-valent conjugate (PCV13) vaccine.** / Consult your health care provider.  Pneumococcal polysaccharide (PPSV23) vaccine.** / 1 to 2 doses if you smoke  cigarettes or if you have certain conditions.  Meningococcal vaccine.** / 1 dose if you are age 6 to 31 years and a Market researcher living in a residence hall, or have one of several medical conditions, you need to get vaccinated against meningococcal disease. You may also need additional booster doses.  Hepatitis A vaccine.** / Consult your health care provider.  Hepatitis B vaccine.** / Consult your health care provider.  Haemophilus influenzae type b (Hib) vaccine.** / Consult your health care provider.

## 2014-11-30 LAB — IRON AND TIBC
%SAT: 17 % — AB (ref 20–55)
Iron: 72 ug/dL (ref 42–145)
TIBC: 412 ug/dL (ref 250–470)
UIBC: 340 ug/dL (ref 125–400)

## 2014-11-30 LAB — BASIC METABOLIC PANEL WITH GFR
BUN: 32 mg/dL — ABNORMAL HIGH (ref 6–23)
CALCIUM: 8.9 mg/dL (ref 8.4–10.5)
CO2: 23 meq/L (ref 19–32)
CREATININE: 1.88 mg/dL — AB (ref 0.50–1.10)
Chloride: 101 mEq/L (ref 96–112)
GFR, EST AFRICAN AMERICAN: 38 mL/min — AB
GFR, Est Non African American: 33 mL/min — ABNORMAL LOW
GLUCOSE: 74 mg/dL (ref 70–99)
Potassium: 4 mEq/L (ref 3.5–5.3)
SODIUM: 137 meq/L (ref 135–145)

## 2014-11-30 LAB — CBC WITH DIFFERENTIAL/PLATELET
Basophils Absolute: 0 10*3/uL (ref 0.0–0.1)
Basophils Relative: 0 % (ref 0–1)
Eosinophils Absolute: 0.1 10*3/uL (ref 0.0–0.7)
Eosinophils Relative: 1 % (ref 0–5)
HCT: 36.4 % (ref 36.0–46.0)
Hemoglobin: 12.2 g/dL (ref 12.0–15.0)
LYMPHS ABS: 1.1 10*3/uL (ref 0.7–4.0)
LYMPHS PCT: 14 % (ref 12–46)
MCH: 27.7 pg (ref 26.0–34.0)
MCHC: 33.5 g/dL (ref 30.0–36.0)
MCV: 82.7 fL (ref 78.0–100.0)
MONO ABS: 0.7 10*3/uL (ref 0.1–1.0)
MONOS PCT: 9 % (ref 3–12)
MPV: 10.2 fL (ref 9.4–12.4)
NEUTROS ABS: 5.7 10*3/uL (ref 1.7–7.7)
NEUTROS PCT: 76 % (ref 43–77)
PLATELETS: 230 10*3/uL (ref 150–400)
RBC: 4.4 MIL/uL (ref 3.87–5.11)
RDW: 13.2 % (ref 11.5–15.5)
WBC: 7.5 10*3/uL (ref 4.0–10.5)

## 2014-11-30 LAB — HEPATIC FUNCTION PANEL
ALBUMIN: 3.8 g/dL (ref 3.5–5.2)
ALK PHOS: 60 U/L (ref 39–117)
ALT: 14 U/L (ref 0–35)
AST: 15 U/L (ref 0–37)
BILIRUBIN DIRECT: 0.1 mg/dL (ref 0.0–0.3)
BILIRUBIN INDIRECT: 0.2 mg/dL (ref 0.2–1.2)
BILIRUBIN TOTAL: 0.3 mg/dL (ref 0.2–1.2)
Total Protein: 7.1 g/dL (ref 6.0–8.3)

## 2014-11-30 LAB — LIPID PANEL
Cholesterol: 159 mg/dL (ref 0–200)
HDL: 36 mg/dL — AB (ref >39)
LDL Cholesterol: 81 mg/dL (ref 0–99)
Total CHOL/HDL Ratio: 4.4 Ratio
Triglycerides: 209 mg/dL — ABNORMAL HIGH (ref <150)
VLDL: 42 mg/dL — ABNORMAL HIGH (ref 0–40)

## 2014-11-30 LAB — INSULIN, FASTING: Insulin fasting, serum: 10.7 u[IU]/mL (ref 2.0–19.6)

## 2014-11-30 LAB — HEMOGLOBIN A1C
Hgb A1c MFr Bld: 5.6 % (ref <5.7)
MEAN PLASMA GLUCOSE: 114 mg/dL (ref <117)

## 2014-11-30 LAB — VITAMIN D 25 HYDROXY (VIT D DEFICIENCY, FRACTURES): Vit D, 25-Hydroxy: 34 ng/mL (ref 30–100)

## 2014-11-30 LAB — TSH: TSH: 2.525 u[IU]/mL (ref 0.350–4.500)

## 2014-11-30 LAB — VITAMIN B12: Vitamin B-12: 517 pg/mL (ref 211–911)

## 2014-11-30 LAB — MAGNESIUM: Magnesium: 2 mg/dL (ref 1.5–2.5)

## 2014-12-12 ENCOUNTER — Other Ambulatory Visit: Payer: Self-pay | Admitting: Physician Assistant

## 2015-01-14 ENCOUNTER — Other Ambulatory Visit: Payer: Self-pay | Admitting: Internal Medicine

## 2015-03-15 ENCOUNTER — Other Ambulatory Visit: Payer: Self-pay | Admitting: Internal Medicine

## 2015-03-29 ENCOUNTER — Other Ambulatory Visit: Payer: Self-pay | Admitting: Internal Medicine

## 2015-03-29 DIAGNOSIS — I150 Renovascular hypertension: Secondary | ICD-10-CM

## 2015-03-29 MED ORDER — ENALAPRIL MALEATE 2.5 MG PO TABS: ORAL_TABLET | ORAL | Status: DC

## 2015-04-18 ENCOUNTER — Other Ambulatory Visit: Payer: Self-pay | Admitting: Obstetrics and Gynecology

## 2015-04-19 LAB — CYTOLOGY - PAP

## 2015-05-31 ENCOUNTER — Ambulatory Visit (INDEPENDENT_AMBULATORY_CARE_PROVIDER_SITE_OTHER): Payer: 59 | Admitting: Physician Assistant

## 2015-05-31 ENCOUNTER — Encounter: Payer: Self-pay | Admitting: Physician Assistant

## 2015-05-31 VITALS — BP 110/70 | HR 68 | Temp 97.7°F | Resp 16 | Ht 63.25 in | Wt 171.0 lb

## 2015-05-31 DIAGNOSIS — E785 Hyperlipidemia, unspecified: Secondary | ICD-10-CM

## 2015-05-31 DIAGNOSIS — E559 Vitamin D deficiency, unspecified: Secondary | ICD-10-CM

## 2015-05-31 DIAGNOSIS — I150 Renovascular hypertension: Secondary | ICD-10-CM

## 2015-05-31 DIAGNOSIS — F411 Generalized anxiety disorder: Secondary | ICD-10-CM

## 2015-05-31 DIAGNOSIS — N183 Chronic kidney disease, stage 3 unspecified: Secondary | ICD-10-CM

## 2015-05-31 DIAGNOSIS — R5383 Other fatigue: Secondary | ICD-10-CM

## 2015-05-31 DIAGNOSIS — Z79899 Other long term (current) drug therapy: Secondary | ICD-10-CM

## 2015-05-31 DIAGNOSIS — E669 Obesity, unspecified: Secondary | ICD-10-CM

## 2015-05-31 LAB — CBC WITH DIFFERENTIAL/PLATELET
BASOS PCT: 0 % (ref 0–1)
Basophils Absolute: 0 10*3/uL (ref 0.0–0.1)
EOS ABS: 0.1 10*3/uL (ref 0.0–0.7)
Eosinophils Relative: 1 % (ref 0–5)
HEMATOCRIT: 38.1 % (ref 36.0–46.0)
Hemoglobin: 13 g/dL (ref 12.0–15.0)
Lymphocytes Relative: 24 % (ref 12–46)
Lymphs Abs: 1.3 10*3/uL (ref 0.7–4.0)
MCH: 28.3 pg (ref 26.0–34.0)
MCHC: 34.1 g/dL (ref 30.0–36.0)
MCV: 82.8 fL (ref 78.0–100.0)
MONO ABS: 0.5 10*3/uL (ref 0.1–1.0)
MONOS PCT: 9 % (ref 3–12)
MPV: 10.7 fL (ref 8.6–12.4)
Neutro Abs: 3.5 10*3/uL (ref 1.7–7.7)
Neutrophils Relative %: 66 % (ref 43–77)
Platelets: 197 10*3/uL (ref 150–400)
RBC: 4.6 MIL/uL (ref 3.87–5.11)
RDW: 13.4 % (ref 11.5–15.5)
WBC: 5.3 10*3/uL (ref 4.0–10.5)

## 2015-05-31 LAB — LIPID PANEL
CHOL/HDL RATIO: 3.7 ratio
CHOLESTEROL: 162 mg/dL (ref 0–200)
HDL: 44 mg/dL — AB (ref >=46)
LDL Cholesterol: 93 mg/dL (ref 0–99)
TRIGLYCERIDES: 127 mg/dL (ref <150)
VLDL: 25 mg/dL (ref 0–40)

## 2015-05-31 LAB — BASIC METABOLIC PANEL WITH GFR
BUN: 30 mg/dL — ABNORMAL HIGH (ref 6–23)
CHLORIDE: 100 meq/L (ref 96–112)
CO2: 23 mEq/L (ref 19–32)
Calcium: 9.6 mg/dL (ref 8.4–10.5)
Creat: 1.87 mg/dL — ABNORMAL HIGH (ref 0.50–1.10)
GFR, Est African American: 39 mL/min — ABNORMAL LOW
GFR, Est Non African American: 34 mL/min — ABNORMAL LOW
GLUCOSE: 69 mg/dL — AB (ref 70–99)
Potassium: 4.1 mEq/L (ref 3.5–5.3)
Sodium: 135 mEq/L (ref 135–145)

## 2015-05-31 LAB — HEPATIC FUNCTION PANEL
ALT: 22 U/L (ref 0–35)
AST: 21 U/L (ref 0–37)
Albumin: 4.2 g/dL (ref 3.5–5.2)
Alkaline Phosphatase: 50 U/L (ref 39–117)
BILIRUBIN DIRECT: 0.1 mg/dL (ref 0.0–0.3)
BILIRUBIN TOTAL: 0.4 mg/dL (ref 0.2–1.2)
Indirect Bilirubin: 0.3 mg/dL (ref 0.2–1.2)
Total Protein: 7.3 g/dL (ref 6.0–8.3)

## 2015-05-31 LAB — IRON AND TIBC
%SAT: 20 % (ref 20–55)
IRON: 76 ug/dL (ref 42–145)
TIBC: 386 ug/dL (ref 250–470)
UIBC: 310 ug/dL (ref 125–400)

## 2015-05-31 LAB — FERRITIN: FERRITIN: 31 ng/mL (ref 10–291)

## 2015-05-31 LAB — VITAMIN B12: Vitamin B-12: 414 pg/mL (ref 211–911)

## 2015-05-31 LAB — TSH: TSH: 2.513 u[IU]/mL (ref 0.350–4.500)

## 2015-05-31 LAB — MAGNESIUM: Magnesium: 1.8 mg/dL (ref 1.5–2.5)

## 2015-05-31 MED ORDER — CITALOPRAM HYDROBROMIDE 40 MG PO TABS: ORAL_TABLET | ORAL | Status: DC

## 2015-05-31 MED ORDER — ENALAPRIL MALEATE 2.5 MG PO TABS: ORAL_TABLET | ORAL | Status: DC

## 2015-05-31 NOTE — Patient Instructions (Signed)
I think it is possible that you have sleep apnea. It can cause interrupted sleep, headaches, frequent awakenings, fatigue, dry mouth, fast/slow heart beats, memory issues, anxiety/depression, swelling, numbness tingling hands/feet, weight gain, shortness of breath, and the list goes on. Sleep apnea needs to be ruled out because if it is left untreated it does eventually lead to abnormal heart beats, lung failure or heart failure as well as increasing the risk of heart attack and stroke. There are masks you can wear OR a mouth piece that I can give you information about. Often times though people feel MUCH better after getting treatment.   Sleep Apnea  Sleep apnea is a sleep disorder characterized by abnormal pauses in breathing while you sleep. When your breathing pauses, the level of oxygen in your blood decreases. This causes you to move out of deep sleep and into light sleep. As a result, your quality of sleep is poor, and the system that carries your blood throughout your body (cardiovascular system) experiences stress. If sleep apnea remains untreated, the following conditions can develop:  High blood pressure (hypertension).  Coronary artery disease.  Inability to achieve or maintain an erection (impotence).  Impairment of your thought process (cognitive dysfunction). There are three types of sleep apnea: 1. Obstructive sleep apnea--Pauses in breathing during sleep because of a blocked airway. 2. Central sleep apnea--Pauses in breathing during sleep because the area of the brain that controls your breathing does not send the correct signals to the muscles that control breathing. 3. Mixed sleep apnea--A combination of both obstructive and central sleep apnea.  RISK FACTORS The following risk factors can increase your risk of developing sleep apnea:  Being overweight.  Smoking.  Having narrow passages in your nose and throat.  Being of older age.  Being female.  Alcohol use.   Sedative and tranquilizer use.  Ethnicity. Among individuals younger than 35 years, African Americans are at increased risk of sleep apnea. SYMPTOMS   Difficulty staying asleep.  Daytime sleepiness and fatigue.  Loss of energy.  Irritability.  Loud, heavy snoring.  Morning headaches.  Trouble concentrating.  Forgetfulness.  Decreased interest in sex. DIAGNOSIS  In order to diagnose sleep apnea, your caregiver will perform a physical examination. Your caregiver may suggest that you take a home sleep test. Your caregiver may also recommend that you spend the night in a sleep lab. In the sleep lab, several monitors record information about your heart, lungs, and brain while you sleep. Your leg and arm movements and blood oxygen level are also recorded. TREATMENT The following actions may help to resolve mild sleep apnea:  Sleeping on your side.   Using a decongestant if you have nasal congestion.   Avoiding the use of depressants, including alcohol, sedatives, and narcotics.   Losing weight and modifying your diet if you are overweight. There also are devices and treatments to help open your airway:  Oral appliances. These are custom-made mouthpieces that shift your lower jaw forward and slightly open your bite. This opens your airway.  Devices that create positive airway pressure. This positive pressure "splints" your airway open to help you breathe better during sleep. The following devices create positive airway pressure:  Continuous positive airway pressure (CPAP) device. The CPAP device creates a continuous level of air pressure with an air pump. The air is delivered to your airway through a mask while you sleep. This continuous pressure keeps your airway open.  Nasal expiratory positive airway pressure (EPAP) device. The EPAP device  creates positive air pressure as you exhale. The device consists of single-use valves, which are inserted into each nostril and held in  place by adhesive. The valves create very little resistance when you inhale but create much more resistance when you exhale. That increased resistance creates the positive airway pressure. This positive pressure while you exhale keeps your airway open, making it easier to breath when you inhale again.  Bilevel positive airway pressure (BPAP) device. The BPAP device is used mainly in patients with central sleep apnea. This device is similar to the CPAP device because it also uses an air pump to deliver continuous air pressure through a mask. However, with the BPAP machine, the pressure is set at two different levels. The pressure when you exhale is lower than the pressure when you inhale.  Surgery. Typically, surgery is only done if you cannot comply with less invasive treatments or if the less invasive treatments do not improve your condition. Surgery involves removing excess tissue in your airway to create a wider passage way. Document Released: 11/09/2002 Document Revised: 03/16/2013 Document Reviewed: 03/27/2012 [facility] Patient Information 2015 [place], [place]. This information is not intended to replace advice given to you by your health care provider. Make sure you discuss any questions you have with your health care provider.     We want weight loss that will last so you should lose 1-2 pounds a week.  THAT IS IT! Please pick THREE things a month to change. Once it is a habit check off the item. Then pick another three items off the list to become habits.  If you are already doing a habit on the list GREAT!  Cross that item off! o Don't drink your calories. Ie, alcohol, soda, fruit juice, and sweet tea.  o Drink more water. Drink a glass when you feel hungry or before each meal.  o Eat breakfast - Complex carb and protein (likeDannon light and fit yogurt, oatmeal, fruit, eggs, [place] bacon). o Measure your cereal.  Eat no more than one cup a day. (ie [place]) o Eat an apple a day. o Add a  vegetable a day. o Try a new vegetable a month. o Use Pam! Stop using oil or butter to cook. o Don't finish your plate or use smaller plates. o Share your dessert. o Eat sugar free Jello for dessert or frozen grapes. o Don't eat 2-3 hours before bed. o Switch to whole wheat bread, pasta, and brown rice. o Make healthier choices when you eat out. No fries! o Pick baked chicken, NOT fried. o Don't forget to SLOW DOWN when you eat. It is not going anywhere.  o Take the stairs. o Park far away in the parking lot o [employer] (or weights) for 10 minutes while watching TV. o Walk at work for 10 minutes during break. o Walk outside 1 time a week with your friend, kids, dog, or significant other. o Start a walking group at [place] the mall as much as you can tolerate.  o Keep a food diary. o Weigh yourself daily. o Walk for 15 minutes 3 days per week. o Cook at home more often and eat out less.  If life happens and you go back to old habits, it is okay.  Just start over. You can do it!   If you experience chest pain, get short of breath, or tired during the exercise, please stop immediately and inform your doctor.   Before you even begin to  attack a weight-loss plan, it pays to remember this: You are not fat. You have fat. Losing weight isn't about blame or shame; it's simply another achievement to accomplish. Dieting is like any other skill-you have to buckle down and work at it. As long as you act in a smart, reasonable way, you'll ultimately get where you want to be. Here are some weight loss pearls for you.  1. It's Not a Diet. It's a Lifestyle Thinking of a diet as something you're on and suffering through only for the short term doesn't work. To shed weight and keep it off, you need to make permanent changes to the way you eat. It's OK to indulge occasionally, of course, but if you cut calories temporarily and then revert to your old way of eating, you'll gain back the weight  quicker than you can say yo-yo. Use it to lose it. Research shows that one of the best predictors of long-term weight loss is how many pounds you drop in the first month. For that reason, nutritionists often suggest being stricter for the first two weeks of your new eating strategy to build momentum. Cut out added sugar and alcohol and avoid unrefined carbs. After that, figure out how you can reincorporate them in a way that's healthy and maintainable.  2. There's a Right Way to Exercise Working out burns calories and fat and boosts your metabolism by building muscle. But those trying to lose weight are notorious for overestimating the number of calories they burn and underestimating the amount they take in. Unfortunately, your system is biologically programmed to hold on to extra pounds and that means when you start exercising, your body senses the deficit and ramps up its hunger signals. If you're not diligent, you'll eat everything you burn and then some. Use it to lose it. Cardio gets all the exercise glory, but strength and interval training are the real heroes. They help you build lean muscle, which in turn increases your metabolism and calorie-burning ability 3. Don't Overreact to Mild Hunger Some people have a hard time losing weight because of hunger anxiety. To them, being hungry is bad-something to be avoided at all costs-so they carry snacks with them and eat when they don't need to. Others eat because they're stressed out or bored. While you never want to get to the point of being ravenous (that's when bingeing is likely to happen), a hunger pang, a craving, or the fact that it's 3:00 p.m. should not send you racing for the vending machine or obsessing about the energy bar in your purse. Ideally, you should put off eating until your stomach is growling and it's difficult to concentrate.  Use it to lose it. When you feel the urge to eat, use the HALT method. Ask yourself, Am I really hungry? Or am  I angry or anxious, lonely or bored, or tired? If you're still not certain, try the apple test. If you're truly hungry, an apple should seem delicious; if it doesn't, something else is going on. Or you can try drinking water and making yourself busy, if you are still hungry try a healthy snack.  4. Not All Calories Are Created Equal The mechanics of weight loss are pretty simple: Take in fewer calories than you use for energy. But the kind of food you eat makes all the difference. Processed food that's high in saturated fat and refined starch or sugar can cause inflammation that disrupts the hormone signals that tell your brain you're full.  The result: You eat a lot more.  Use it to lose it. Clean up your diet. Swap in whole, unprocessed foods, including vegetables, lean protein, and healthy fats that will fill you up and give you the biggest nutritional bang for your calorie buck. In a few weeks, as your brain starts receiving regular hunger and fullness signals once again, you'll notice that you feel less hungry overall and naturally start cutting back on the amount you eat.  5. Protein, Produce, and Plant-Based Fats Are Your Weight-Loss Trinity Here's why eating the three Ps regularly will help you drop pounds. Protein fills you up. You need it to build lean muscle, which keeps your metabolism humming so that you can torch more fat. People in a weight-loss program who ate double the recommended daily allowance for protein (about 110 grams for a 150-pound woman) lost 70 percent of their weight from fat, while people who ate the RDA lost only about 40 percent, one study found. Produce is packed with filling fiber. "It's very difficult to consume too many calories if you're eating a lot of vegetables. Example: Three cups of broccoli is a lot of food, yet only 93 calories. (Fruit is another story. It can be easy to overeat and can contain a lot of calories from sugar, so be sure to monitor your  intake.) Plant-based fats like olive oil and those in avocados and nuts are healthy and extra satiating.  Use it to lose it. Aim to incorporate each of the three Ps into every meal and snack. People who eat protein throughout the day are able to keep weight off, according to a study in the [place] of Clinical Nutrition. In addition to meat, poultry and seafood, good sources are beans, lentils, eggs, tofu, and yogurt. As for fat, keep portion sizes in check by measuring out salad dressing, oil, and nut butters (shoot for one to two tablespoons). Finally, eat veggies or a little fruit at every meal. People who did that consumed 308 fewer calories but didn't feel any hungrier than when they didn't eat more produce.  7. How You Eat Is As Important As What You Eat In order for your brain to register that you're full, you need to focus on what you're eating. Sit down whenever you eat, preferably at a table. Turn off the TV or computer, put down your phone, and look at your food. Smell it. Chew slowly, and don't put another bite on your fork until you swallow. When women ate lunch this attentively, they consumed 30 percent less when snacking later than those who listened to an audiobook at lunchtime, according to a study in the [place] of Nutrition. 8. Weighing Yourself Really Works The scale provides the best evidence about whether your efforts are paying off. Seeing the numbers tick up or down or stagnate is motivation to keep going-or to rethink your approach. A 2015 study at [facility] found that daily weigh-ins helped people lose more weight, keep it off, and maintain that loss, even after two years. Use it to lose it. Step on the scale at the same time every day for the best results. If your weight shoots up several pounds from one weigh-in to the next, don't freak out. Eating a lot of salt the night before or having your period is the likely culprit. The number should return to normal  in a day or two. It's a steady climb that you need to do something about. 9. Too Much Stress  and Too Little Sleep Are Your Enemies When you're tired and frazzled, your body cranks up the production of cortisol, the stress hormone that can cause carb cravings. Not getting enough sleep also boosts your levels of ghrelin, a hormone associated with hunger, while suppressing leptin, a hormone that signals fullness and satiety. People on a diet who slept only five and a half hours a night for two weeks lost 55 percent less fat and were hungrier than those who slept eight and a half hours, according to a study in the [place]. Use it to lose it. Prioritize sleep, aiming for seven hours or more a night, which research shows helps lower stress. And make sure you're getting quality zzz's. If a snoring spouse or a fidgety cat wakes you up frequently throughout the night, you may end up getting the equivalent of just four hours of sleep, according to a study from [facility]. Keep pets out of the bedroom, and use a white-noise app to drown out snoring. 10. You Will Hit a plateau-And You Can Bust Through It As you slim down, your body releases much less leptin, the fullness hormone.  If you're not strength training, start right now. Building muscle can raise your metabolism to help you overcome a plateau. To keep your body challenged and burning calories, incorporate new moves and more intense intervals into your workouts or add another sweat session to your weekly routine. Alternatively, cut an extra 100 calories or so a day from your diet. Now that you've lost weight, your body simply doesn't need as much fuel.

## 2015-05-31 NOTE — Progress Notes (Signed)
Assessment and Plan:  1. Hypertension -Continue medication, monitor blood pressure at home. Continue DASH diet.  Reminder to go to the ER if any CP, SOB, nausea, dizziness, severe HA, changes vision/speech, left arm numbness and tingling and jaw pain.  2. Cholesterol -Continue diet and exercise. Check cholesterol.   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 Vitamin D Def - check level and continue medications.   5. Fatigue Check labs, continue iron  Continue diet and meds as discussed. Further disposition pending results of labs. Over 30 minutes of exam, counseling, chart review, and critical decision making was performed  HPI 39 y.o. female  presents for 3 month follow up on hypertension, cholesterol, prediabetes, and vitamin D deficiency.   Her blood pressure has been controlled at home, today their BP is BP: 110/70 mmHg  She does workout, she has twins that 39 year old boy and girl.  She denies chest pain, shortness of breath, dizziness. 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   She is not on cholesterol medication and denies myalgias. Her cholesterol is at goal. The cholesterol last visit was:   Lab Results  Component Value Date   CHOL 159 11/29/2014   HDL 36* 11/29/2014   LDLCALC 81 11/29/2014   TRIG 209* 11/29/2014   CHOLHDL 4.4 11/29/2014    She has been working on diet and exercise for prediabetes, and denies paresthesia of the feet, polydipsia, polyuria and visual disturbances. Last A1C in the office was:  Lab Results  Component Value Date   HGBA1C 5.6 11/29/2014   Patient is on Vitamin D supplement.   Lab Results  Component Value Date   VD25OH 34 11/29/2014     She is on celexa for OCD/anxiety and she is doing better with this.  BMI is Body mass index is 30.04 kg/(m^2)., she is working on diet and exercise. Wt Readings from Last 3 Encounters:  05/31/15 171 lb (77.565 kg)  11/29/14 172 lb  9.6 oz (78.291 kg)  11/24/13 161 lb 6.4 oz (73.211 kg)   Current Medications:  Current Outpatient Prescriptions on File Prior to Visit  Medication Sig Dispense Refill  . citalopram (CELEXA) 40 MG tablet TAKE 1 TABLET BY MOUTH EVERY DAY FOR MOOD/DEPRESSION 90 tablet 3  . enalapril (VASOTEC) 2.5 MG tablet Take 1 tablet daily for BP & Kidney Protection 90 tablet 1   No current facility-administered medications on file prior to visit.   Medical History:  Past Medical History  Diagnosis Date  . Osteosarcoma   . AMA (advanced maternal age) primigravida 33+   . Renal insufficiency     Secondary to chemotherapy  . Hypertension   . OCD (obsessive compulsive disorder)    Allergies: No Known Allergies   Review of Systems:  Review of Systems  Constitutional: Positive for malaise/fatigue. Negative for fever, chills, weight loss and diaphoresis.  HENT: Negative.   Eyes: Negative.   Respiratory: Negative.   Cardiovascular: Negative.   Gastrointestinal: Negative.   Genitourinary: Negative.   Musculoskeletal: Negative.   Skin: Negative.   Neurological: Negative.  Negative for weakness.  Endo/Heme/Allergies: Negative.   Psychiatric/Behavioral: Negative.  Negative for depression and memory loss. The patient is not nervous/anxious and does not have insomnia.     Family history- Review and unchanged Social history- Review and unchanged Physical Exam: BP 110/70 mmHg  Pulse 68  Temp(Src) 97.7 F (36.5 C)  Resp 16  Ht 5' 3.25" (1.607 m)  Wt 171 lb (77.565 kg)  BMI 30.04 kg/m2 Wt Readings from Last 3 Encounters:  05/31/15 171 lb (77.565 kg)  11/29/14 172 lb 9.6 oz (78.291 kg)  11/24/13 161 lb 6.4 oz (73.211 kg)   General Appearance: Well nourished, in no apparent distress. Eyes: PERRLA, EOMs, conjunctiva no swelling or erythema Sinuses: No Frontal/maxillary tenderness ENT/Mouth: Ext aud canals clear, TMs without erythema, bulging. No erythema, swelling, or exudate on post pharynx.   Tonsils not swollen or erythematous. Hearing normal.  Neck: Supple, thyroid normal.  Respiratory: Respiratory effort normal, BS equal bilaterally without rales, rhonchi, wheezing or stridor.  Cardio: RRR with no MRGs. Brisk peripheral pulses without edema.  Abdomen: Soft, + BS,  Non tender, no guarding, rebound, hernias, masses. Lymphatics: Non tender without lymphadenopathy.  Musculoskeletal: Full ROM, 5/5 strength, Normal gait Skin: Warm, dry without rashes, lesions, ecchymosis.  Neuro: Cranial nerves intact. Normal muscle tone, no cerebellar symptoms. Psych: Awake and oriented X 3, normal affect, Insight and Judgment appropriate.    Vicie Mutters, PA-C 10:55 AM [facility]

## 2015-06-01 LAB — VITAMIN D 25 HYDROXY (VIT D DEFICIENCY, FRACTURES): Vit D, 25-Hydroxy: 53 ng/mL (ref 30–100)

## 2015-06-19 IMAGING — MG MM DIAGNOSTIC UNILATERAL R
2 series · 2 of 2 positions shown · non-contrast
Comparison: 02/01/2014 baseline screening mammogram.

CLINICAL DATA: 37-year-old female with possible right breast mass
on baseline screening mammogram.

EXAM:
DIGITAL DIAGNOSTIC  RIGHT MAMMOGRAM

[R CC]
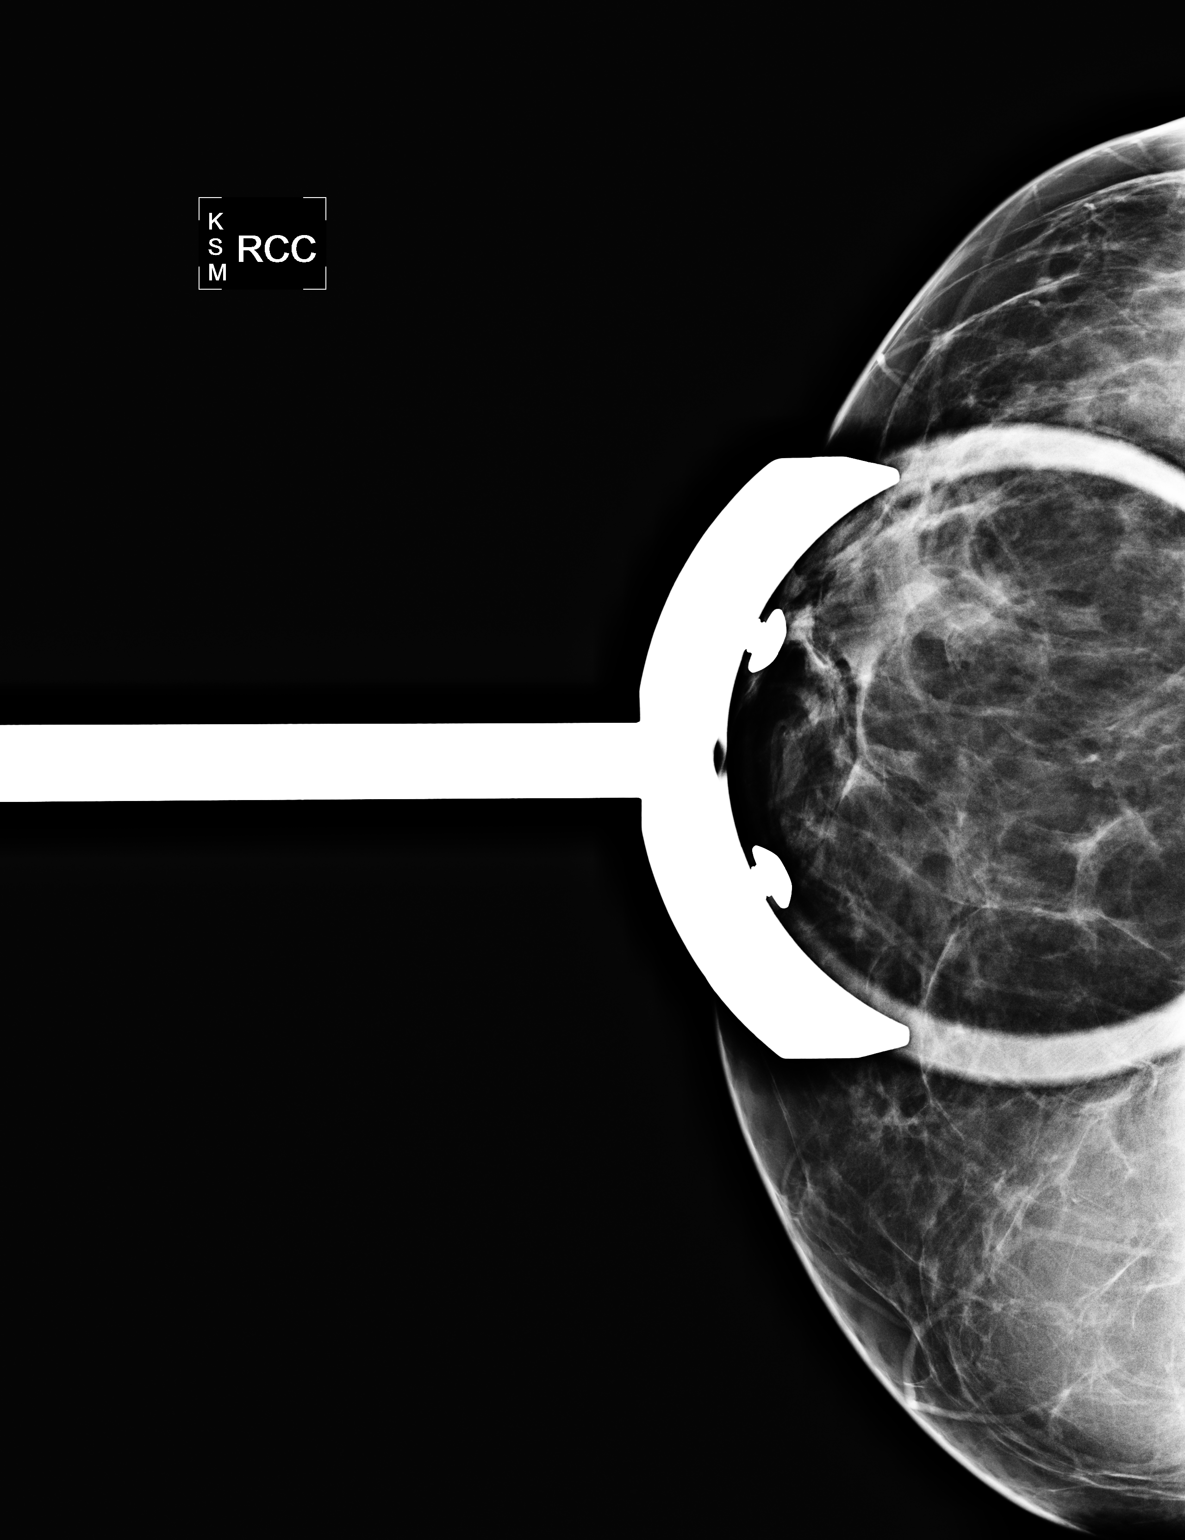

[R MLO]
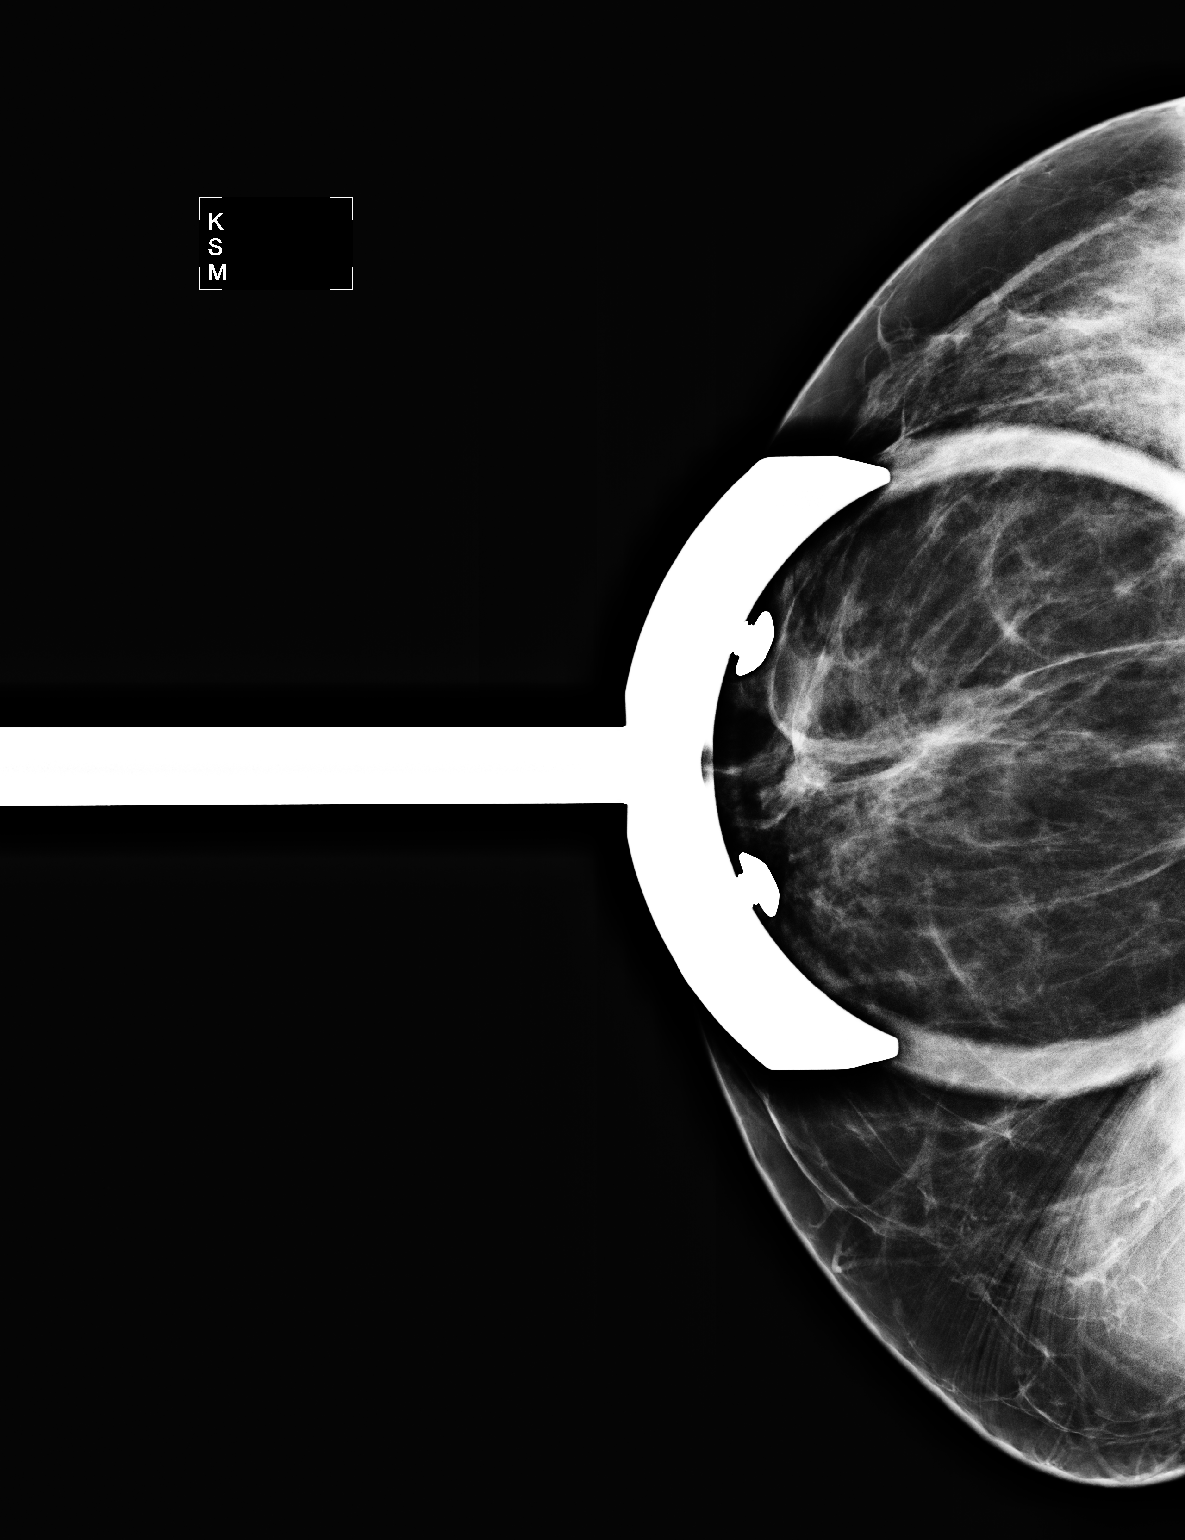

[2 of 2 positions shown; findings below may reference images not displayed]

ACR Breast Density Category b: There are scattered areas of
fibroglandular density.
FINDINGS: Spot compression views of the retroareolar right breast demonstrate
no persistent mass, distortion or worrisome calcifications.
IMPRESSION: No persistent abnormality in the area of the screening study
finding.

RECOMMENDATION:
Bilateral screening mammograms at age 40.

I have discussed the findings and recommendations with the patient.
Results were also provided in writing at the conclusion of the
visit. If applicable, a reminder letter will be sent to the patient
regarding the next appointment.

BI-RADS CATEGORY  1: Negative

## 2015-09-01 ENCOUNTER — Ambulatory Visit: Payer: Self-pay | Admitting: Physician Assistant

## 2015-12-12 ENCOUNTER — Encounter: Payer: Self-pay | Admitting: Internal Medicine

## 2015-12-12 ENCOUNTER — Ambulatory Visit (INDEPENDENT_AMBULATORY_CARE_PROVIDER_SITE_OTHER): Payer: No Typology Code available for payment source | Admitting: Internal Medicine

## 2015-12-12 ENCOUNTER — Other Ambulatory Visit: Payer: Self-pay | Admitting: *Deleted

## 2015-12-12 VITALS — BP 114/64 | HR 80 | Temp 97.2°F | Resp 16 | Ht 63.5 in | Wt 178.6 lb

## 2015-12-12 DIAGNOSIS — Z0000 Encounter for general adult medical examination without abnormal findings: Secondary | ICD-10-CM

## 2015-12-12 DIAGNOSIS — Z0001 Encounter for general adult medical examination with abnormal findings: Secondary | ICD-10-CM

## 2015-12-12 DIAGNOSIS — N183 Chronic kidney disease, stage 3 unspecified: Secondary | ICD-10-CM

## 2015-12-12 DIAGNOSIS — I150 Renovascular hypertension: Secondary | ICD-10-CM

## 2015-12-12 DIAGNOSIS — Z79899 Other long term (current) drug therapy: Secondary | ICD-10-CM

## 2015-12-12 DIAGNOSIS — Z1212 Encounter for screening for malignant neoplasm of rectum: Secondary | ICD-10-CM

## 2015-12-12 DIAGNOSIS — Z111 Encounter for screening for respiratory tuberculosis: Secondary | ICD-10-CM

## 2015-12-12 DIAGNOSIS — L309 Dermatitis, unspecified: Secondary | ICD-10-CM

## 2015-12-12 DIAGNOSIS — R5383 Other fatigue: Secondary | ICD-10-CM

## 2015-12-12 DIAGNOSIS — R7309 Other abnormal glucose: Secondary | ICD-10-CM

## 2015-12-12 DIAGNOSIS — E559 Vitamin D deficiency, unspecified: Secondary | ICD-10-CM

## 2015-12-12 DIAGNOSIS — E785 Hyperlipidemia, unspecified: Secondary | ICD-10-CM

## 2015-12-12 LAB — CBC WITH DIFFERENTIAL/PLATELET
BASOS ABS: 0 10*3/uL (ref 0.0–0.1)
Basophils Relative: 0 % (ref 0–1)
Eosinophils Absolute: 0.1 10*3/uL (ref 0.0–0.7)
Eosinophils Relative: 2 % (ref 0–5)
HCT: 36.9 % (ref 36.0–46.0)
HEMOGLOBIN: 12.5 g/dL (ref 12.0–15.0)
LYMPHS PCT: 18 % (ref 12–46)
Lymphs Abs: 1.1 10*3/uL (ref 0.7–4.0)
MCH: 28.5 pg (ref 26.0–34.0)
MCHC: 33.9 g/dL (ref 30.0–36.0)
MCV: 84.1 fL (ref 78.0–100.0)
MPV: 10.8 fL (ref 8.6–12.4)
Monocytes Absolute: 0.3 10*3/uL (ref 0.1–1.0)
Monocytes Relative: 5 % (ref 3–12)
NEUTROS ABS: 4.6 10*3/uL (ref 1.7–7.7)
NEUTROS PCT: 75 % (ref 43–77)
Platelets: 191 10*3/uL (ref 150–400)
RBC: 4.39 MIL/uL (ref 3.87–5.11)
RDW: 12.6 % (ref 11.5–15.5)
WBC: 6.1 10*3/uL (ref 4.0–10.5)

## 2015-12-12 LAB — HEPATIC FUNCTION PANEL
ALBUMIN: 4 g/dL (ref 3.6–5.1)
ALT: 19 U/L (ref 6–29)
AST: 17 U/L (ref 10–30)
Alkaline Phosphatase: 49 U/L (ref 33–115)
Total Bilirubin: 0.4 mg/dL (ref 0.2–1.2)
Total Protein: 6.9 g/dL (ref 6.1–8.1)

## 2015-12-12 LAB — TSH: TSH: 1.928 u[IU]/mL (ref 0.350–4.500)

## 2015-12-12 LAB — BASIC METABOLIC PANEL WITH GFR
BUN: 32 mg/dL — ABNORMAL HIGH (ref 7–25)
CO2: 25 mmol/L (ref 20–31)
Calcium: 8.9 mg/dL (ref 8.6–10.2)
Chloride: 105 mmol/L (ref 98–110)
Creat: 1.93 mg/dL — ABNORMAL HIGH (ref 0.50–1.10)
GFR, EST AFRICAN AMERICAN: 37 mL/min — AB (ref >=60)
GFR, EST NON AFRICAN AMERICAN: 32 mL/min — AB (ref >=60)
Glucose, Bld: 87 mg/dL (ref 65–99)
POTASSIUM: 3.9 mmol/L (ref 3.5–5.3)
SODIUM: 139 mmol/L (ref 135–146)

## 2015-12-12 LAB — HEMOGLOBIN A1C
HEMOGLOBIN A1C: 5.4 % (ref <5.7)
Mean Plasma Glucose: 108 mg/dL (ref <117)

## 2015-12-12 LAB — LIPID PANEL
CHOL/HDL RATIO: 3.4 ratio (ref <=5.0)
Cholesterol: 158 mg/dL (ref 125–200)
HDL: 46 mg/dL (ref >=46)
LDL Cholesterol: 76 mg/dL (ref <130)
Triglycerides: 178 mg/dL — ABNORMAL HIGH (ref <150)
VLDL: 36 mg/dL — ABNORMAL HIGH (ref <30)

## 2015-12-12 LAB — IRON AND TIBC
%SAT: 25 % (ref 11–50)
IRON: 92 ug/dL (ref 40–190)
TIBC: 366 ug/dL (ref 250–450)
UIBC: 274 ug/dL (ref 125–400)

## 2015-12-12 LAB — MAGNESIUM: MAGNESIUM: 2 mg/dL (ref 1.5–2.5)

## 2015-12-12 LAB — VITAMIN B12: Vitamin B-12: 485 pg/mL (ref 211–911)

## 2015-12-12 MED ORDER — TRIAMCINOLONE ACETONIDE 0.1 % EX CREA: 1.0000 "application " | TOPICAL_CREAM | Freq: Two times a day (BID) | CUTANEOUS | Status: DC

## 2015-12-12 MED ORDER — ENALAPRIL MALEATE 2.5 MG PO TABS: ORAL_TABLET | ORAL | Status: DC

## 2015-12-12 NOTE — Patient Instructions (Signed)
Recommend Adult Low Dose Aspirin or   coated  Aspirin 81 mg daily   To reduce risk of Colon Cancer 20 %,   Skin Cancer 26 % ,   Melanoma 46%   and   Pancreatic cancer 60%   ++++++++++++++++++++++++++++++++++++++++++++++++++++++  Vitamin D goal   is between 70-100.   Please make sure that you are taking your Vitamin D as directed.   It is very important as a natural anti-inflammatory   helping hair, skin, and nails, as well as reducing stroke and heart attack risk.   It helps your bones and helps with mood.  It also decreases numerous cancer risks so please take it as directed.   Low Vit D is associated with a 200-300% higher risk for CANCER   and 200-300% higher risk for HEART   ATTACK  &  STROKE.   ......................................  It is also associated with higher death rate at younger ages,   autoimmune diseases like Rheumatoid arthritis, Lupus, Multiple Sclerosis.     Also many other serious conditions, like depression, Alzheimer's  Dementia, infertility, muscle aches, fatigue, fibromyalgia - just to name a few.  ++++++++++++++++++++++++++++++++++++++++++++++++  Recommend the book "The END of DIETING" by Dr Joel Fuhrman   & the book "The END of DIABETES " by Dr Joel Fuhrman  At [employer] - get book & Audio CD's     Being diabetic has a  300% increased risk for heart attack, stroke, cancer, and alzheimer- type vascular dementia. It is very important that you work harder with diet by avoiding all foods that are white. Avoid white rice (brown & wild rice is OK), white potatoes (sweetpotatoes in moderation is OK), White bread or wheat bread or anything made out of white flour like bagels, donuts, rolls, buns, biscuits, cakes, pastries, cookies, pizza crust, and pasta (made from white flour & egg whites) - vegetarian pasta or spinach or wheat pasta is OK. Multigrain breads like Arnold's or [employer], or multigrain sandwich thins or flatbreads.  Diet,  exercise and weight loss can reverse and cure diabetes in the early stages.  Diet, exercise and weight loss is very important in the control and prevention of complications of diabetes which affects every system in your body, ie. Brain - dementia/stroke, eyes - glaucoma/blindness, heart - heart attack/heart failure, kidneys - dialysis, stomach - gastric paralysis, intestines - malabsorption, nerves - severe painful neuritis, circulation - gangrene & loss of a leg(s), and finally cancer and Alzheimers.    I recommend avoid fried & greasy foods,  sweets/candy, white rice (brown or wild rice or Quinoa is OK), white potatoes (sweet potatoes are OK) - anything made from white flour - bagels, doughnuts, rolls, buns, biscuits,white and wheat breads, pizza crust and traditional pasta made of white flour & egg white(vegetarian pasta or spinach or wheat pasta is OK).  Multi-grain bread is OK - like multi-grain flat bread or sandwich thins. Avoid alcohol in excess. Exercise is also important.    Eat all the vegetables you want - avoid meat, especially red meat and dairy - especially cheese.  Cheese is the most concentrated form of trans-fats which is the worst thing to clog up our arteries. Veggie cheese is OK which can be found in the fresh produce section at [employer] or [employer] or [employer]  ++++++++++++++++++++++++++++++++++++++++++++++++++ DASH Eating Plan  DASH stands for "Dietary Approaches to Stop Hypertension."   The DASH eating plan is a healthy eating plan that has been shown to reduce high   blood pressure (hypertension). Additional health benefits may include reducing the risk of type 2 diabetes mellitus, heart disease, and stroke. The DASH eating plan may also help with weight loss.  WHAT DO I NEED TO KNOW ABOUT THE DASH EATING PLAN? For the DASH eating plan, you will follow these general guidelines:  Choose foods with a percent daily value for sodium of less than 5% (as listed on the food  label).  Use salt-free seasonings or herbs instead of table salt or sea salt.  Check with your health care provider or pharmacist before using salt substitutes.  Eat lower-sodium products, often labeled as "lower sodium" or "no salt added."  Eat fresh foods.  Eat more vegetables, fruits, and low-fat dairy products.    Choose whole grains. Look for the word "whole" as the first word in the ingredient list.  Choose fish   Limit sweets, desserts, sugars, and sugary drinks.  Choose heart-healthy fats.  Eat veggie cheese   Eat more home-cooked food and less restaurant, buffet, and fast food.  Limit fried foods.  Cook foods using methods other than frying.  Limit canned vegetables. If you do use them, rinse them well to decrease the sodium.  When eating at a restaurant, ask that your food be prepared with less salt, or no salt if possible.                      WHAT FOODS CAN I EAT?  Seek help from a dietitian for individual calorie needs.  Grains Whole grain or whole wheat bread. Brown rice. Whole grain or whole wheat pasta. Quinoa, bulgur, and whole grain cereals. Low-sodium cereals. Corn or whole wheat flour tortillas. Whole grain cornbread. Whole grain crackers. Low-sodium crackers.  Vegetables Fresh or frozen vegetables (raw, steamed, roasted, or grilled). Low-sodium or reduced-sodium tomato and vegetable juices. Low-sodium or reduced-sodium tomato sauce and paste. Low-sodium or reduced-sodium canned vegetables.   Fruits All fresh, canned (in natural juice), or frozen fruits.  Protein Products  All fish and seafood.  Dried beans, peas, or lentils. Unsalted nuts and seeds. Unsalted canned beans.  Dairy Low-fat dairy products, such as skim or 1% milk, 2% or reduced-fat cheeses, low-fat ricotta or cottage cheese, or plain low-fat yogurt. Low-sodium or reduced-sodium cheeses.  Fats and Oils Tub margarines without trans fats. Light or reduced-fat mayonnaise and salad  dressings (reduced sodium). Avocado. Safflower, olive, or canola oils. Natural peanut or almond butter.  Other Unsalted popcorn and pretzels. The items listed above may not be a complete list of recommended foods or beverages. Contact your dietitian for more options.  +++++++++++++++++++++++++++++++++++++++++++  WHAT FOODS ARE NOT RECOMMENDED?  Grains/ White flour or wheat flour  White bread. White pasta. White rice. Refined cornbread. Bagels and croissants. Crackers that contain trans fat.  Vegetables  Creamed or fried vegetables. Vegetables in a . Regular canned vegetables. Regular canned tomato sauce and paste. Regular tomato and vegetable juices.  Fruits Dried fruits. Canned fruit in light or heavy syrup. Fruit juice.  Meat and Other Protein Products Meat in general. Fatty cuts of meat. Ribs, chicken wings, bacon, sausage, bologna, salami, chitterlings, fatback, hot dogs, bratwurst, and packaged luncheon meats.  Dairy Whole or 2% milk, cream, half-and-half, and cream cheese. Whole-fat or sweetened yogurt. Full-fat cheeses or blue cheese. Nondairy creamers and whipped toppings. Processed cheese, cheese spreads, or cheese curds.  Condiments Onion and garlic salt, seasoned salt, table salt, and sea salt. Canned and packaged gravies. Worcestershire sauce. Tartar sauce.  Barbecue sauce. Teriyaki sauce. Soy sauce, including reduced sodium. Steak sauce. Fish sauce. Oyster sauce. Cocktail sauce. Horseradish. Ketchup and mustard. Meat flavorings and tenderizers. Bouillon cubes. Hot sauce. Tabasco sauce. Marinades. Taco seasonings. Relishes.  Fats and Oils Butter, stick margarine, lard, shortening, ghee, and bacon fat. Coconut, palm kernel, or palm oils. Regular salad dressings.  Pickles and olives. Salted popcorn and pretzels. The items listed above may not be a complete list of foods and beverages to avoid.   Preventive Care for Adults  A healthy lifestyle and preventive care can  promote health and wellness. Preventive health guidelines for women include the following key practices.  A routine yearly physical is a good way to check with your health care provider about your health and preventive screening. It is a chance to share any concerns and updates on your health and to receive a thorough exam.  Visit your dentist for a routine exam and preventive care every 6 months. Brush your teeth twice a day and floss once a day. Good oral hygiene prevents tooth decay and gum disease.  The frequency of eye exams is based on your age, health, family medical history, use of contact lenses, and other factors. Follow your health care provider's recommendations for frequency of eye exams.  Eat a healthy diet. Foods like vegetables, fruits, whole grains, low-fat dairy products, and lean protein foods contain the nutrients you need without too many calories. Decrease your intake of foods high in solid fats, added sugars, and salt. Eat the right amount of calories for you.Get information about a proper diet from your health care provider, if necessary.  Regular physical exercise is one of the most important things you can do for your health. Most adults should get at least 150 minutes of moderate-intensity exercise (any activity that increases your heart rate and causes you to sweat) each week. In addition, most adults need muscle-strengthening exercises on 2 or more days a week.  Maintain a healthy weight. The body mass index (BMI) is a screening tool to identify possible weight problems. It provides an estimate of body fat based on height and weight. Your health care provider can find your BMI and can help you achieve or maintain a healthy weight.For adults 20 years and older:  A BMI below 18.5 is considered underweight.  A BMI of 18.5 to 24.9 is normal.  A BMI of 25 to 29.9 is considered overweight.  A BMI of 30 and above is considered obese.  Maintain normal blood lipids and  cholesterol levels by exercising and minimizing your intake of saturated fat. Eat a balanced diet with plenty of fruit and vegetables. Blood tests for lipids and cholesterol should begin at age 31 and be repeated every 5 years. If your lipid or cholesterol levels are high, you are over 50, or you are at high risk for heart disease, you may need your cholesterol levels checked more frequently.Ongoing high lipid and cholesterol levels should be treated with medicines if diet and exercise are not working.  If you smoke, find out from your health care provider how to quit. If you do not use tobacco, do not start.  Lung cancer screening is recommended for adults aged 83-80 years who are at high risk for developing lung cancer because of a history of smoking. A yearly low-dose CT scan of the lungs is recommended for people who have at least a 30-pack-year history of smoking and are a current smoker or have quit within the past 15 years.  A pack year of smoking is smoking an average of 1 pack of cigarettes a day for 1 year (for example: 1 pack a day for 30 years or 2 packs a day for 15 years). Yearly screening should continue until the smoker has stopped smoking for at least 15 years. Yearly screening should be stopped for people who develop a health problem that would prevent them from having lung cancer treatment.  If you are pregnant, do not drink alcohol. If you are breastfeeding, be very cautious about drinking alcohol. If you are not pregnant and choose to drink alcohol, do not have more than 1 drink per day. One drink is considered to be 12 ounces (355 mL) of beer, 5 ounces (148 mL) of wine, or 1.5 ounces (44 mL) of liquor.  Avoid use of street drugs. Do not share needles with anyone. Ask for help if you need support or instructions about stopping the use of drugs.  High blood pressure causes heart disease and increases the risk of stroke. Your blood pressure should be checked at least every 1 to 2 years.  Ongoing high blood pressure should be treated with medicines if weight loss and exercise do not work.  If you are 85-69 years old, ask your health care provider if you should take aspirin to prevent strokes.  Diabetes screening involves taking a blood sample to check your fasting blood sugar level. This should be done once every 3 years, after age 17, if you are within normal weight and without risk factors for diabetes. Testing should be considered at a younger age or be carried out more frequently if you are overweight and have at least 1 risk factor for diabetes.  Breast cancer screening is essential preventive care for women. You should practice "breast self-awareness." This means understanding the normal appearance and feel of your breasts and may include breast self-examination. Any changes detected, no matter how small, should be reported to a health care provider. Women in their 4s and 30s should have a clinical breast exam (CBE) by a health care provider as part of a regular health exam every 1 to 3 years. After age 64, women should have a CBE every year. Starting at age 31, women should consider having a mammogram (breast X-ray test) every year. Women who have a family history of breast cancer should talk to their health care provider about genetic screening. Women at a high risk of breast cancer should talk to their health care providers about having an MRI and a mammogram every year.  Breast cancer gene (BRCA)-related cancer risk assessment is recommended for women who have family members with BRCA-related cancers. BRCA-related cancers include breast, ovarian, tubal, and peritoneal cancers. Having family members with these cancers may be associated with an increased risk for harmful changes (mutations) in the breast cancer genes BRCA1 and BRCA2. Results of the assessment will determine the need for genetic counseling and BRCA1 and BRCA2 testing.  Routine pelvic exams to screen for cancer are  no longer recommended for nonpregnant women who are considered low risk for cancer of the pelvic organs (ovaries, uterus, and vagina) and who do not have symptoms. Ask your health care provider if a screening pelvic exam is right for you.  If you have had past treatment for cervical cancer or a condition that could lead to cancer, you need Pap tests and screening for cancer for at least 20 years after your treatment. If Pap tests have been discontinued, your risk factors (such as  having a new sexual partner) need to be reassessed to determine if screening should be resumed. Some women have medical problems that increase the chance of getting cervical cancer. In these cases, your health care provider may recommend more frequent screening and Pap tests.  The HPV test is an additional test that may be used for cervical cancer screening. The HPV test looks for the virus that can cause the cell changes on the cervix. The cells collected during the Pap test can be tested for HPV. The HPV test could be used to screen women aged 45 years and older, and should be used in women of any age who have unclear Pap test results. After the age of 16, women should have HPV testing at the same frequency as a Pap test.  Colorectal cancer can be detected and often prevented. Most routine colorectal cancer screening begins at the age of 62 years and continues through age 59 years. However, your health care provider may recommend screening at an earlier age if you have risk factors for colon cancer. On a yearly basis, your health care provider may provide home test kits to check for hidden blood in the stool. Use of a small camera at the end of a tube, to directly examine the colon (sigmoidoscopy or colonoscopy), can detect the earliest forms of colorectal cancer. Talk to your health care provider about this at age 16, when routine screening begins. Direct exam of the colon should be repeated every 5-10 years through age 71 years,  unless early forms of pre-cancerous polyps or small growths are found.  People who are at an increased risk for hepatitis B should be screened for this virus. You are considered at high risk for hepatitis B if:  You were born in a country where hepatitis B occurs often. Talk with your health care provider about which countries are considered high risk.  Your parents were born in a high-risk country and you have not received a shot to protect against hepatitis B (hepatitis B vaccine).  You have HIV or AIDS.  You use needles to inject street drugs.  You live with, or have sex with, someone who has hepatitis B.  You get hemodialysis treatment.  You take certain medicines for conditions like cancer, organ transplantation, and autoimmune conditions.  Hepatitis C blood testing is recommended for all people born from [birth year] through 1965 and any individual with known risks for hepatitis C.  Practice safe sex. Use condoms and avoid high-risk sexual practices to reduce the spread of sexually transmitted infections (STIs). STIs include gonorrhea, chlamydia, syphilis, trichomonas, herpes, HPV, and human immunodeficiency virus (HIV). Herpes, HIV, and HPV are viral illnesses that have no cure. They can result in disability, cancer, and death.  You should be screened for sexually transmitted illnesses (STIs) including gonorrhea and chlamydia if:  You are sexually active and are younger than 24 years.  You are older than 24 years and your health care provider tells you that you are at risk for this type of infection.  Your sexual activity has changed since you were last screened and you are at an increased risk for chlamydia or gonorrhea. Ask your health care provider if you are at risk.  If you are at risk of being infected with HIV, it is recommended that you take a prescription medicine daily to prevent HIV infection. This is called preexposure prophylaxis (PrEP). You are considered at risk if:  You  are a heterosexual woman, are sexually active, and  are at increased risk for HIV infection.  You take drugs by injection.  You are sexually active with a partner who has HIV.  Talk with your health care provider about whether you are at high risk of being infected with HIV. If you choose to begin PrEP, you should first be tested for HIV. You should then be tested every 3 months for as long as you are taking PrEP.  Osteoporosis is a disease in which the bones lose minerals and strength with aging. This can result in serious bone fractures or breaks. The risk of osteoporosis can be identified using a bone density scan. Women ages 5 years and over and women at risk for fractures or osteoporosis should discuss screening with their health care providers. Ask your health care provider whether you should take a calcium supplement or vitamin D to reduce the rate of osteoporosis.  Menopause can be associated with physical symptoms and risks. Hormone replacement therapy is available to decrease symptoms and risks. You should talk to your health care provider about whether hormone replacement therapy is right for you.  Use sunscreen. Apply sunscreen liberally and repeatedly throughout the day. You should seek shade when your shadow is shorter than you. Protect yourself by wearing long sleeves, pants, a wide-brimmed hat, and sunglasses year round, whenever you are outdoors.  Once a month, do a whole body skin exam, using a mirror to look at the skin on your back. Tell your health care provider of new moles, moles that have irregular borders, moles that are larger than a pencil eraser, or moles that have changed in shape or color.  Stay current with required vaccines (immunizations).  Influenza vaccine. All adults should be immunized every year.  Tetanus, diphtheria, and acellular pertussis (Td, Tdap) vaccine. Pregnant women should receive 1 dose of Tdap vaccine during each pregnancy. The dose should be  obtained regardless of the length of time since the last dose. Immunization is preferred during the 27th-36th week of gestation. An adult who has not previously received Tdap or who does not know her vaccine status should receive 1 dose of Tdap. This initial dose should be followed by tetanus and diphtheria toxoids (Td) booster doses every 10 years. Adults with an unknown or incomplete history of completing a 3-dose immunization series with Td-containing vaccines should begin or complete a primary immunization series including a Tdap dose. Adults should receive a Td booster every 10 years.  Varicella vaccine. An adult without evidence of immunity to varicella should receive 2 doses or a second dose if she has previously received 1 dose. Pregnant females who do not have evidence of immunity should receive the first dose after pregnancy. This first dose should be obtained before leaving the health care facility. The second dose should be obtained 4-8 weeks after the first dose.  Human papillomavirus (HPV) vaccine. Females aged 13-26 years who have not received the vaccine previously should obtain the 3-dose series. The vaccine is not recommended for use in pregnant females. However, pregnancy testing is not needed before receiving a dose. If a female is found to be pregnant after receiving a dose, no treatment is needed. In that case, the remaining doses should be delayed until after the pregnancy. Immunization is recommended for any person with an immunocompromised condition through the age of 7 years if she did not get any or all doses earlier. During the 3-dose series, the second dose should be obtained 4-8 weeks after the first dose. The third dose should  be obtained 24 weeks after the first dose and 16 weeks after the second dose.  Zoster vaccine. One dose is recommended for adults aged 75 years or older unless certain conditions are present.  Measles, mumps, and rubella (MMR) vaccine. Adults born before  25 generally are considered immune to measles and mumps. Adults born in [birth year] or later should have 1 or more doses of MMR vaccine unless there is a contraindication to the vaccine or there is laboratory evidence of immunity to each of the three diseases. A routine second dose of MMR vaccine should be obtained at least 28 days after the first dose for students attending postsecondary schools, health care workers, or international travelers. People who received inactivated measles vaccine or an unknown type of measles vaccine during 1963-1967 should receive 2 doses of MMR vaccine. People who received inactivated mumps vaccine or an unknown type of mumps vaccine before 1979 and are at high risk for mumps infection should consider immunization with 2 doses of MMR vaccine. For females of childbearing age, rubella immunity should be determined. If there is no evidence of immunity, females who are not pregnant should be vaccinated. If there is no evidence of immunity, females who are pregnant should delay immunization until after pregnancy. Unvaccinated health care workers born before [birth year] who lack laboratory evidence of measles, mumps, or rubella immunity or laboratory confirmation of disease should consider measles and mumps immunization with 2 doses of MMR vaccine or rubella immunization with 1 dose of MMR vaccine.  Pneumococcal 13-valent conjugate (PCV13) vaccine. When indicated, a person who is uncertain of her immunization history and has no record of immunization should receive the PCV13 vaccine. An adult aged 11 years or older who has certain medical conditions and has not been previously immunized should receive 1 dose of PCV13 vaccine. This PCV13 should be followed with a dose of pneumococcal polysaccharide (PPSV23) vaccine. The PPSV23 vaccine dose should be obtained at least 8 weeks after the dose of PCV13 vaccine. An adult aged 23 years or older who has certain medical conditions and previously received 1 or  more doses of PPSV23 vaccine should receive 1 dose of PCV13. The PCV13 vaccine dose should be obtained 1 or more years after the last PPSV23 vaccine dose.  Pneumococcal polysaccharide (PPSV23) vaccine. When PCV13 is also indicated, PCV13 should be obtained first. All adults aged 74 years and older should be immunized. An adult younger than age 30 years who has certain medical conditions should be immunized. Any person who resides in a nursing home or long-term care facility should be immunized. An adult smoker should be immunized. People with an immunocompromised condition and certain other conditions should receive both PCV13 and PPSV23 vaccines. People with human immunodeficiency virus (HIV) infection should be immunized as soon as possible after diagnosis. Immunization during chemotherapy or radiation therapy should be avoided. Routine use of PPSV23 vaccine is not recommended for American Indians, [place] Natives, or people younger than 65 years unless there are medical conditions that require PPSV23 vaccine. When indicated, people who have unknown immunization and have no record of immunization should receive PPSV23 vaccine. One-time revaccination 5 years after the first dose of PPSV23 is recommended for people aged 19-64 years who have chronic kidney failure, nephrotic syndrome, asplenia, or immunocompromised conditions. People who received 1-2 doses of PPSV23 before age 4 years should receive another dose of PPSV23 vaccine at age 16 years or later if at least 5 years have passed since the previous dose. Doses of PPSV23  are not needed for people immunized with PPSV23 at or after age 7 years.  Meningococcal vaccine. Adults with asplenia or persistent complement component deficiencies should receive 2 doses of quadrivalent meningococcal conjugate (MenACWY-D) vaccine. The doses should be obtained at least 2 months apart. Microbiologists working with certain meningococcal bacteria, [place] recruits, people at  risk during an outbreak, and people who travel to or live in countries with a high rate of meningitis should be immunized. A first-year college student up through age 105 years who is living in a residence hall should receive a dose if she did not receive a dose on or after her 16th birthday. Adults who have certain high-risk conditions should receive one or more doses of vaccine.  Hepatitis A vaccine. Adults who wish to be protected from this disease, have certain high-risk conditions, work with hepatitis A-infected animals, work in hepatitis A research labs, or travel to or work in countries with a high rate of hepatitis A should be immunized. Adults who were previously unvaccinated and who anticipate close contact with an international adoptee during the first 60 days after arrival in the [place] from a country with a high rate of hepatitis A should be immunized.  Hepatitis B vaccine. Adults who wish to be protected from this disease, have certain high-risk conditions, may be exposed to blood or other infectious body fluids, are household contacts or sex partners of hepatitis B positive people, are clients or workers in certain care facilities, or travel to or work in countries with a high rate of hepatitis B should be immunized.  Haemophilus influenzae type b (Hib) vaccine. A previously unvaccinated person with asplenia or sickle cell disease or having a scheduled splenectomy should receive 1 dose of Hib vaccine. Regardless of previous immunization, a recipient of a hematopoietic stem cell transplant should receive a 3-dose series 6-12 months after her successful transplant. Hib vaccine is not recommended for adults with HIV infection. Preventive Services / Frequency  Ages 12 to 69 years  Blood pressure check.  Lipid and cholesterol check.  Clinical breast exam.** / Every 3 years for women in their 93s and 64s.  BRCA-related cancer risk assessment.** / For women who have family members with  a BRCA-related cancer (breast, ovarian, tubal, or peritoneal cancers).  Pap test.** / Every 2 years from ages 58 through 35. Every 3 years starting at age 25 through age 52 or 28 with a history of 3 consecutive normal Pap tests.  HPV screening.** / Every 3 years from ages 70 through ages 38 to 77 with a history of 3 consecutive normal Pap tests.  Hepatitis C blood test.** / For any individual with known risks for hepatitis C.  Skin self-exam. / Monthly.  Influenza vaccine. / Every year.  Tetanus, diphtheria, and acellular pertussis (Tdap, Td) vaccine.** / Consult your health care provider. Pregnant women should receive 1 dose of Tdap vaccine during each pregnancy. 1 dose of Td every 10 years.  Varicella vaccine.** / Consult your health care provider. Pregnant females who do not have evidence of immunity should receive the first dose after pregnancy.  HPV vaccine. / 3 doses over 6 months, if 25 and younger. The vaccine is not recommended for use in pregnant females. However, pregnancy testing is not needed before receiving a dose.  Measles, mumps, rubella (MMR) vaccine.** / You need at least 1 dose of MMR if you were born in 1957 or later. You may also need a 2nd dose. For females of childbearing age,  rubella immunity should be determined. If there is no evidence of immunity, females who are not pregnant should be vaccinated. If there is no evidence of immunity, females who are pregnant should delay immunization until after pregnancy.  Pneumococcal 13-valent conjugate (PCV13) vaccine.** / Consult your health care provider.  Pneumococcal polysaccharide (PPSV23) vaccine.** / 1 to 2 doses if you smoke cigarettes or if you have certain conditions.  Meningococcal vaccine.** / 1 dose if you are age 48 to 9 years and a Market researcher living in a residence hall, or have one of several medical conditions, you need to get vaccinated against meningococcal disease. You may also need  additional booster doses.  Hepatitis A vaccine.** / Consult your health care provider.  Hepatitis B vaccine.** / Consult your health care provider.  Haemophilus influenzae type b (Hib) vaccine.** / Consult your health care provider.

## 2015-12-12 NOTE — Progress Notes (Signed)
Patient ID: Jackie Wilson, female   DOB: 08-Mar-1976, 40 y.o.   MRN: [id number]  Annual Screening/Preventative Visit And Comprehensive Evaluation &  Examination  This very nice 40 y.o. MWF presents for presents for a Wellness/Preventative Visit & comprehensive evaluation and management of multiple medical co-morbidities.  Patient has been followed for HTN, CKD 3 , Prediabetes, Hyperlipidemia and Vitamin D Deficiency.  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    HTN predates since 2002. Patient's BP has been controlled at home and patient denies any cardiac symptoms as chest pain, palpitations, shortness of breath, dizziness or ankle swelling. Today's BP: 114/64 mmHg    Patient's hyperlipidemia (LDL 111 in 2012)  is controlled with diet. Patient denies myalgias or other medication SE's. Last lipids were at goal with Cholesterol 162; HDL 44*; LDL 93; Triglycerides 127 on 05/31/2015.   Patient has morbid obesity (BMI 31+) and is proactively screened for prediabetes and patient denies reactive hypoglycemic symptoms, visual blurring, diabetic polys, or paresthesias. Last A1c was 5.6% in Dec 2015.     Finally, patient has history of Vitamin D Deficiency  Of "25" in 2010 and last Vitamin D was 53 on 05/31/2015.    Medication Sig  . citalopram (CELEXA) 40 MG tablet TAKE 1 TABLET BY MOUTH EVERY DAY FOR MOOD/DEPRESSION  . enalapril (VASOTEC) 2.5 MG tablet Take 1 tablet daily for BP & Kidney Protection  . JUNEL FE 1.5/30 Take 1 tablet by mouth daily.   No Known Allergies   Past Medical History  Diagnosis Date  . Osteosarcoma ([place])   . AMA (advanced maternal age) primigravida 41+   . Renal insufficiency     Secondary to chemotherapy  . Hypertension   . OCD (obsessive compulsive disorder)    Health Maintenance  Topic Date Due  . INFLUENZA VACCINE   07/04/2015  . PAP SMEAR  04/17/2018  . TETANUS/TDAP  07/11/2022  . HIV Screening  Completed   Immunization History  Administered Date(s) Administered  . PPD Test 11/24/2013, 11/29/2014, 12/12/2015  . Pneumococcal Polysaccharide-23 11/24/2013  . Tdap 07/11/2012   Past Surgical History  Procedure Laterality Date  . Right leg reconstruction    . Leep    . No past surgeries    . Tumor removal      Right leg  . Cesarean section  09/08/2012    Procedure: CESAREAN SECTION;  Surgeon: Luz Lex, MD;  Location: [facility];  Service: Obstetrics;  Laterality: N/A;   Family History  Problem Relation Age of Onset  . Other Neg Hx   . Diabetes Father   . Hypertension Father    Social History  Substance Use Topics  . Smoking status: Former Smoker    Quit date: 12/03/1996  . Smokeless tobacco: Never Used  . Alcohol Use: 1.0 oz/week    2 drink(s) per week    ROS Constitutional: Denies fever, chills, weight loss/gain, headaches, insomnia,  night sweats, and change in appetite. Does c/o fatigue. Eyes: Denies redness, blurred vision, diplopia, discharge, itchy, watery eyes.  ENT: Denies discharge, congestion, post nasal drip, epistaxis, sore throat, earache, hearing loss, dental pain, Tinnitus, Vertigo, Sinus pain, snoring.  Cardio: Denies chest pain, palpitations, irregular heartbeat, syncope, dyspnea, diaphoresis, orthopnea, PND, claudication, edema Respiratory: denies cough, dyspnea, DOE, pleurisy, hoarseness, laryngitis, wheezing.  Gastrointestinal: Denies dysphagia, heartburn, reflux, water brash, pain, cramps, nausea, vomiting,  bloating, diarrhea, constipation, hematemesis, melena, hematochezia, jaundice, hemorrhoids Genitourinary: Denies dysuria, frequency, urgency, nocturia, hesitancy, discharge, hematuria, flank pain Breast: Breast lumps, nipple discharge, bleeding.  Musculoskeletal: Denies arthralgia, myalgia, stiffness, Jt. Swelling, pain, limp, and strain/sprain. Denies falls. Skin:  Denies puritis, rash, hives, warts, acne, eczema, changing in skin lesion Neuro: No weakness, tremor, incoordination, spasms, paresthesia, pain Psychiatric: Denies confusion, memory loss, sensory loss. Denies Depression. Endocrine: Denies change in weight, skin, hair change, nocturia, and paresthesia, diabetic polys, visual blurring, hyper / hypo glycemic episodes.  Heme/Lymph: No excessive bleeding, bruising, enlarged lymph nodes.  Physical Exam  BP 114/64 mmHg  Pulse 80  Temp(Src) 97.2 F (36.2 C)  Resp 16  Ht 5' 3.5" (1.613 m)  Wt 178 lb 9.6 oz (81.012 kg)  BMI 31.14 kg/m2  General Appearance: Well nourished and in no apparent distress. Eyes: PERRLA, EOMs, conjunctiva no swelling or erythema, normal fundi and vessels. Sinuses: No frontal/maxillary tenderness ENT/Mouth: EACs patent / TMs  nl. Nares clear without erythema, swelling, mucoid exudates. Oral hygiene is good. No erythema, swelling, or exudate. Tongue normal, non-obstructing. Tonsils not swollen or erythematous. Hearing normal.  Neck: Supple, thyroid normal. No bruits, nodes or JVD. Respiratory: Respiratory effort normal.  BS equal and clear bilateral without rales, rhonci, wheezing or stridor. Cardio: Heart sounds are normal with regular rate and rhythm and no murmurs, rubs or gallops. Peripheral pulses are normal and equal bilaterally without edema. No aortic or femoral bruits. Chest: symmetric with normal excursions and percussion. Breasts: Symmetric, without lumps, nipple discharge, retractions, or fibrocystic changes.  Abdomen: Flat, soft, with bowl sounds. Nontender, no guarding, rebound, hernias, masses, or organomegaly.  Lymphatics: Non tender without lymphadenopathy.  Genitourinary:  Musculoskeletal: Full ROM all peripheral extremities, joint stability, 5/5 strength, and normal gait. Skin: Warm and dry without rashes, lesions, cyanosis, clubbing or  ecchymosis.  Neuro: Cranial nerves intact, reflexes equal  bilaterally. Normal muscle tone, no cerebellar symptoms. Sensation intact.  Pysch: Alert and oriented X 3, normal affect, Insight and Judgment appropriate.   Assessment and Plan  1. Annual Preventative Screening Examination  - EKG 12-Lead - POC Hemoccult Bld/Stl  - Urinalysis, Routine w reflex microscopic - Vitamin B12 - Iron and TIBC - CBC with Differential/Platelet - BASIC METABOLIC PANEL WITH GFR - Hepatic function panel - Magnesium - Lipid panel - TSH - Hemoglobin A1c - Insulin, random - VITAMIN D 25 Hydroxy   2. Renovascular hypertension  - EKG 12-Lead - TSH  3. Hyperlipidemia  - Lipid panel - TSH  4. Abnormal glucose  - Hemoglobin A1c - Insulin, random  5. Vitamin D deficiency  - VITAMIN D 25 Hydroxy  6. Chronic kidney disease (CKD), stage III   7. Other fatigue  - Vitamin B12 - Iron and TIBC - CBC with Differential/Platelet - TSH  8. Screening for rectal cancer  - POC Hemoccult Bld/Stl   9. Eczema  - triamcinolone cream (KENALOG) 0.1 %; Apply 1 application topically 2 (two) times daily. Apply to rash 2 to 3 x day as needed  Dispense: 80 g; Refill: 1  10. Screening examination for pulmonary tuberculosis  - PPD  11. Medication management  - Urinalysis, Routine w reflex microscopic  - CBC with Differential/Platelet - BASIC METABOLIC PANEL WITH GFR - Hepatic function panel - Magnesium     Continue prudent diet as discussed, weight control, BP monitoring, regular exercise, and medications. Discussed med's effects and SE's. Screening labs and tests as requested with regular follow-up as recommended. Over 40 minutes  of exam, counseling, chart review and high complex critical decision making was performed.

## 2015-12-13 LAB — URINALYSIS, ROUTINE W REFLEX MICROSCOPIC
Bilirubin Urine: NEGATIVE
GLUCOSE, UA: NEGATIVE
HGB URINE DIPSTICK: NEGATIVE
Ketones, ur: NEGATIVE
Leukocytes, UA: NEGATIVE
NITRITE: NEGATIVE
Protein, ur: NEGATIVE
Specific Gravity, Urine: 1.01 (ref 1.001–1.035)
pH: 6.5 (ref 5.0–8.0)

## 2015-12-13 LAB — INSULIN, RANDOM: Insulin: 74.5 u[IU]/mL — ABNORMAL HIGH (ref 2.0–19.6)

## 2015-12-13 LAB — VITAMIN D 25 HYDROXY (VIT D DEFICIENCY, FRACTURES): VIT D 25 HYDROXY: 45 ng/mL (ref 30–100)

## 2016-01-08 ENCOUNTER — Emergency Department (HOSPITAL_COMMUNITY)
Admission: EM | Admit: 2016-01-08 | Discharge: 2016-01-08 | Disposition: A | Discharge status: Home / Self Care | Payer: No Typology Code available for payment source | Attending: Emergency Medicine | Admitting: Emergency Medicine

## 2016-01-08 ENCOUNTER — Other Ambulatory Visit: Payer: Self-pay | Admitting: Internal Medicine

## 2016-01-08 ENCOUNTER — Encounter (HOSPITAL_COMMUNITY): Payer: Self-pay | Admitting: *Deleted

## 2016-01-08 ENCOUNTER — Emergency Department (HOSPITAL_COMMUNITY): Payer: No Typology Code available for payment source

## 2016-01-08 DIAGNOSIS — B349 Viral infection, unspecified: Secondary | ICD-10-CM | POA: Diagnosis not present

## 2016-01-08 DIAGNOSIS — N189 Chronic kidney disease, unspecified: Secondary | ICD-10-CM | POA: Diagnosis not present

## 2016-01-08 DIAGNOSIS — R112 Nausea with vomiting, unspecified: Secondary | ICD-10-CM

## 2016-01-08 DIAGNOSIS — Z79899 Other long term (current) drug therapy: Secondary | ICD-10-CM | POA: Insufficient documentation

## 2016-01-08 DIAGNOSIS — Z8659 Personal history of other mental and behavioral disorders: Secondary | ICD-10-CM | POA: Diagnosis not present

## 2016-01-08 DIAGNOSIS — R05 Cough: Secondary | ICD-10-CM | POA: Diagnosis present

## 2016-01-08 DIAGNOSIS — Z85830 Personal history of malignant neoplasm of bone: Secondary | ICD-10-CM | POA: Insufficient documentation

## 2016-01-08 DIAGNOSIS — Z87448 Personal history of other diseases of urinary system: Secondary | ICD-10-CM | POA: Diagnosis not present

## 2016-01-08 DIAGNOSIS — I129 Hypertensive chronic kidney disease with stage 1 through stage 4 chronic kidney disease, or unspecified chronic kidney disease: Secondary | ICD-10-CM | POA: Insufficient documentation

## 2016-01-08 DIAGNOSIS — Z87891 Personal history of nicotine dependence: Secondary | ICD-10-CM | POA: Insufficient documentation

## 2016-01-08 LAB — BASIC METABOLIC PANEL
ANION GAP: 14 (ref 5–15)
BUN: 25 mg/dL — ABNORMAL HIGH (ref 6–20)
CHLORIDE: 105 mmol/L (ref 101–111)
CO2: 17 mmol/L — ABNORMAL LOW (ref 22–32)
Calcium: 9.1 mg/dL (ref 8.9–10.3)
Creatinine, Ser: 1.84 mg/dL — ABNORMAL HIGH (ref 0.44–1.00)
GFR calc non Af Amer: 33 mL/min — ABNORMAL LOW (ref >60)
GFR, EST AFRICAN AMERICAN: 39 mL/min — AB (ref >60)
Glucose, Bld: 75 mg/dL (ref 65–99)
POTASSIUM: 3.8 mmol/L (ref 3.5–5.1)
SODIUM: 136 mmol/L (ref 135–145)

## 2016-01-08 LAB — CBC WITH DIFFERENTIAL/PLATELET
BASOS ABS: 0 10*3/uL (ref 0.0–0.1)
BASOS PCT: 0 %
EOS ABS: 0 10*3/uL (ref 0.0–0.7)
Eosinophils Relative: 0 %
HCT: 40 % (ref 36.0–46.0)
HEMOGLOBIN: 13.6 g/dL (ref 12.0–15.0)
LYMPHS ABS: 1 10*3/uL (ref 0.7–4.0)
Lymphocytes Relative: 11 %
MCH: 28.8 pg (ref 26.0–34.0)
MCHC: 34 g/dL (ref 30.0–36.0)
MCV: 84.6 fL (ref 78.0–100.0)
Monocytes Absolute: 0.8 10*3/uL (ref 0.1–1.0)
Monocytes Relative: 8 %
NEUTROS PCT: 81 %
Neutro Abs: 7.5 10*3/uL (ref 1.7–7.7)
Platelets: 164 10*3/uL (ref 150–400)
RBC: 4.73 MIL/uL (ref 3.87–5.11)
RDW: 12.7 % (ref 11.5–15.5)
WBC: 9.3 10*3/uL (ref 4.0–10.5)

## 2016-01-08 MED ORDER — DIPHENHYDRAMINE HCL 50 MG/ML IJ SOLN
25.0000 mg | Freq: Once | INTRAMUSCULAR | Status: AC
  Administered 2016-01-08: 25 mg via INTRAVENOUS
  Filled 2016-01-08: qty 1

## 2016-01-08 MED ORDER — ONDANSETRON 4 MG PO TBDP
4.0000 mg | ORAL_TABLET | Freq: Three times a day (TID) | ORAL | Status: DC | PRN
Start: 2016-01-08 — End: 2017-01-24

## 2016-01-08 MED ORDER — METOCLOPRAMIDE HCL 5 MG/ML IJ SOLN
10.0000 mg | INTRAMUSCULAR | Status: AC
  Administered 2016-01-08: 10 mg via INTRAVENOUS
  Filled 2016-01-08: qty 2

## 2016-01-08 MED ORDER — SODIUM CHLORIDE 0.9 % IV BOLUS (SEPSIS)
1000.0000 mL | Freq: Once | INTRAVENOUS | Status: AC
  Administered 2016-01-08: 1000 mL via INTRAVENOUS

## 2016-01-08 NOTE — ED Provider Notes (Signed)
CSN: [id number]     Arrival date & time 01/08/16  1322 History   First MD Initiated Contact with Patient 01/08/16 1554     Chief Complaint  Patient presents with  . Generalized Body Aches  . Headache  . Cough     (Consider location/radiation/quality/duration/timing/severity/associated sxs/prior Treatment) Patient is a 40 y.o. female presenting with headaches and cough. The history is provided by the patient, the spouse and medical records.  Headache Associated symptoms: congestion and cough   Cough Associated symptoms: headaches     40 year old female with history of hypertension, OCD, osteo-sarcoma treated with chemotherapy, chronic kidney disease, presenting to the ED for fever, body aches, headache, and productive cough for the past 5 days. Patient is also had nausea, vomiting, diarrhea.  She states decreased appetite throughout the day today due to her nausea.  She has been trying to drink water throughout the day.  Patient's 65-year-old twin boys have recently been sick with ear infections as well, currently on amoxicillin.  Patient has been taking tylenol at home without significant relief.  VSS.   Past Medical History  Diagnosis Date  . 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 AMA (advanced maternal age) primigravida 59+   . Renal insufficiency     Secondary to chemotherapy  . Hypertension   . OCD (obsessive compulsive disorder)    Past Surgical History  Procedure Laterality Date  . Right leg replacement    . Leep    . No past surgeries    . Tumor removal      Right leg  . Cesarean section  09/08/2012    Procedure: CESAREAN SECTION;  Surgeon: Luz Lex, MD;  Location: [facility];  Service: Obstetrics;  Laterality: N/A;   Family History  Problem Relation Age of Onset  . Other Neg Hx   . Diabetes Father   . Hypertension Father    Social History  Substance Use Topics  . Smoking status: Former Smoker    Quit date: 12/03/1996  . Smokeless tobacco: Never Used  . Alcohol Use: 1.0 oz/week     2 drink(s) per week   OB History    Gravida Para Term Preterm AB TAB SAB Ectopic Multiple Living   1 1 1  0 0 0 0 0 1 2     Review of Systems  HENT: Positive for congestion.   Respiratory: Positive for cough.   Neurological: Positive for headaches.  All other systems reviewed and are negative.     Allergies  Review of patient's allergies indicates no known allergies.  Home Medications   Prior to Admission medications   Medication Sig Start Date End Date Taking? Authorizing Provider  citalopram (CELEXA) 40 MG tablet TAKE 1 TABLET BY MOUTH EVERY DAY FOR MOOD/DEPRESSION Patient taking differently: Take 40 mg by mouth daily.  05/31/15  Yes Vicie Mutters, PA-C  enalapril (VASOTEC) 2.5 MG tablet Take 1 tablet daily for BP & Kidney Protection Patient taking differently: Take 2.5 mg by mouth daily.  12/12/15 06/12/16 Yes Unk Pinto, MD  triamcinolone cream (KENALOG) 0.1 % Apply 1 application topically 2 (two) times daily. Apply to rash 2 to 3 x day as needed Patient not taking: Reported on 01/08/2016 12/12/15   Unk Pinto, MD   BP 119/83 mmHg  Pulse 105  Temp(Src) 98.3 F (36.8 C) (Oral)  Resp 18  SpO2 100%  LMP 01/01/2016   Physical Exam  Constitutional: She is oriented to person, place, and time. She appears well-developed and well-nourished. No distress.  HENT:  Head: Normocephalic and atraumatic.  Right Ear: Tympanic membrane and ear canal normal.  Left Ear: Tympanic membrane and ear canal normal.  Nose: Mucosal edema and rhinorrhea present.  Mouth/Throat: Uvula is midline, oropharynx is clear and moist and mucous membranes are normal.  Nasal congestion, clear rhinorrhea; tonsils normal in appearance bilaterally without exudate; uvula midline without peritonsillar abscess; handling secretions appropriately; no difficulty swallowing or speaking  Eyes: Conjunctivae and EOM are normal. Pupils are equal, round, and reactive to light.  Neck: Normal range of motion and  full passive range of motion without pain. Neck supple. No spinous process tenderness and no muscular tenderness present. No rigidity.  Full ROM, no rigidity  Cardiovascular: Normal rate, regular rhythm and normal heart sounds.   Pulmonary/Chest: Effort normal and breath sounds normal. No respiratory distress. She has no wheezes. She has no rhonchi. She has no rales.  Abdominal: Soft. Bowel sounds are normal. There is no tenderness. There is no guarding.  Musculoskeletal: Normal range of motion.  Neurological: She is alert and oriented to person, place, and time.  AAOx3, answering questions appropriately; equal strength UE and LE bilaterally; CN grossly intact; moves all extremities appropriately without ataxia; no focal neuro deficits or facial asymmetry appreciated  Skin: Skin is warm and dry. She is not diaphoretic.  Psychiatric: She has a normal mood and affect.  Nursing note and vitals reviewed.   ED Course  Procedures (including critical care time) Labs Review Labs Reviewed  BASIC METABOLIC PANEL - Abnormal; Notable for the following:    CO2 17 (*)    BUN 25 (*)    Creatinine, Ser 1.84 (*)    GFR calc non Af Amer 33 (*)    GFR calc Af Amer 39 (*)    All other components within normal limits  CBC WITH DIFFERENTIAL/PLATELET    Imaging Review Dg Chest 2 View  01/08/2016  CLINICAL DATA:  39 year old female with cough fever and congestion for 5 days. Body ache. Initial encounter. EXAM: CHEST  2 VIEW COMPARISON:  None. FINDINGS: Low normal lung volumes. Normal cardiac size and mediastinal contours. Visualized tracheal air column is within normal limits. The lungs are clear. No pneumothorax or pleural effusion. Minimal levoconvex curvature of the spine. No other osseous abnormality identified. IMPRESSION: Negative, no acute cardiopulmonary abnormality. Electronically Signed   By: Genevie Ann M.D.   On: 01/08/2016 16:40   I have personally reviewed and evaluated these images and lab results  as part of my medical decision-making.   EKG Interpretation None      MDM   Final diagnoses:  Viral illness  Nausea and vomiting, vomiting of unspecified type   40 year old female here for suspected viral illness with cough, nasal congestion, nausea, vomiting, diarrhea, and headache. Patient is afebrile and nontoxic in appearance. She does have some nasal congestion and rhinorrhea on exam.  Lungs are overall clear.  Patient has had little oral intake today, she appears mildly dehydrated on exam. Patient does report she had severe kidney damage from her chemotherapy years ago, her baseline creatinine is around 2.0 and she has this checked regularly by her primary care physician.  Will obtain basic labs and chest x-ray.  Patient was given IV fluids, Benadryl, Reglan.  After treatment patient reports she feels significantly better. Her vital signs remained stable. She has been drinking water without difficulty. I discussed her labs with her and that they continue to appear baseline given her chronic kidney damage. Her chest x-ray is  also clear. I suspect her symptoms are viral in nature. She reports her headache has resolved at this time. She remains neurologically intact without any clinical manifestations concerning for meningitis.  Feel patient is appropriate for discharge. Discharge home with supportive care including Zofran. Encouraged to rest, drink fluids, gentle diet and progress back to normal as tolerated. She will follow-up with her primary care physician.  Discussed plan with patient, he/she acknowledged understanding and agreed with plan of care.  Return precautions given for new or worsening symptoms.  Larene Pickett, PA-C 01/08/16 1859  Daleen Bo, MD 01/08/16 [id number]

## 2016-01-08 NOTE — ED Notes (Addendum)
Pt reports fever, body aches, h/a and cough x 5 days.  Pt also reports n/v.   Reports she has not eaten d/t nausea.  Pt reports taking tylenol about an hour ago at 1245.

## 2016-01-08 NOTE — Discharge Instructions (Signed)
Take the prescribed medication as directed. Patient to rest and drink plenty of fluids. Recommend to start with gentle diet and progress back to normal as tolerated. Follow-up with your primary care physician. Return to the ED for new or worsening symptoms.

## 2016-04-02 ENCOUNTER — Other Ambulatory Visit: Payer: Self-pay | Admitting: Internal Medicine

## 2016-06-11 ENCOUNTER — Encounter: Payer: Self-pay | Admitting: Internal Medicine

## 2016-06-11 ENCOUNTER — Ambulatory Visit (INDEPENDENT_AMBULATORY_CARE_PROVIDER_SITE_OTHER): Payer: No Typology Code available for payment source | Admitting: Internal Medicine

## 2016-06-11 VITALS — BP 96/58 | HR 90 | Temp 98.4°F | Resp 16 | Ht 63.5 in | Wt 176.0 lb

## 2016-06-11 DIAGNOSIS — N183 Chronic kidney disease, stage 3 unspecified: Secondary | ICD-10-CM

## 2016-06-11 DIAGNOSIS — I150 Renovascular hypertension: Secondary | ICD-10-CM | POA: Diagnosis not present

## 2016-06-11 DIAGNOSIS — E559 Vitamin D deficiency, unspecified: Secondary | ICD-10-CM

## 2016-06-11 DIAGNOSIS — R7309 Other abnormal glucose: Secondary | ICD-10-CM

## 2016-06-11 DIAGNOSIS — E785 Hyperlipidemia, unspecified: Secondary | ICD-10-CM

## 2016-06-11 DIAGNOSIS — Z79899 Other long term (current) drug therapy: Secondary | ICD-10-CM | POA: Diagnosis not present

## 2016-06-11 LAB — LIPID PANEL
CHOL/HDL RATIO: 3.2 ratio (ref <=5.0)
Cholesterol: 166 mg/dL (ref 125–200)
HDL: 52 mg/dL (ref >=46)
LDL Cholesterol: 81 mg/dL (ref <130)
TRIGLYCERIDES: 163 mg/dL — AB (ref <150)
VLDL: 33 mg/dL — ABNORMAL HIGH (ref <30)

## 2016-06-11 LAB — HEPATIC FUNCTION PANEL
ALBUMIN: 4.1 g/dL (ref 3.6–5.1)
ALK PHOS: 55 U/L (ref 33–115)
ALT: 16 U/L (ref 6–29)
AST: 17 U/L (ref 10–30)
BILIRUBIN TOTAL: 0.6 mg/dL (ref 0.2–1.2)
Bilirubin, Direct: 0.1 mg/dL (ref <=0.2)
Indirect Bilirubin: 0.5 mg/dL (ref 0.2–1.2)
Total Protein: 7 g/dL (ref 6.1–8.1)

## 2016-06-11 LAB — BASIC METABOLIC PANEL WITH GFR
BUN: 29 mg/dL — AB (ref 7–25)
CALCIUM: 9.1 mg/dL (ref 8.6–10.2)
CO2: 22 mmol/L (ref 20–31)
Chloride: 105 mmol/L (ref 98–110)
Creat: 1.93 mg/dL — ABNORMAL HIGH (ref 0.50–1.10)
GFR, EST AFRICAN AMERICAN: 37 mL/min — AB (ref >=60)
GFR, EST NON AFRICAN AMERICAN: 32 mL/min — AB (ref >=60)
Glucose, Bld: 63 mg/dL — ABNORMAL LOW (ref 65–99)
POTASSIUM: 4 mmol/L (ref 3.5–5.3)
Sodium: 138 mmol/L (ref 135–146)

## 2016-06-11 LAB — CBC WITH DIFFERENTIAL/PLATELET
BASOS PCT: 0 %
Basophils Absolute: 0 cells/uL (ref 0–200)
Eosinophils Absolute: 106 cells/uL (ref 15–500)
Eosinophils Relative: 2 %
HCT: 38.1 % (ref 35.0–45.0)
Hemoglobin: 13.1 g/dL (ref 11.7–15.5)
LYMPHS PCT: 23 %
Lymphs Abs: 1219 cells/uL (ref 850–3900)
MCH: 29.1 pg (ref 27.0–33.0)
MCHC: 34.4 g/dL (ref 32.0–36.0)
MCV: 84.7 fL (ref 80.0–100.0)
MONOS PCT: 8 %
MPV: 10.5 fL (ref 7.5–12.5)
Monocytes Absolute: 424 cells/uL (ref 200–950)
Neutro Abs: 3551 cells/uL (ref 1500–7800)
Neutrophils Relative %: 67 %
PLATELETS: 203 10*3/uL (ref 140–400)
RBC: 4.5 MIL/uL (ref 3.80–5.10)
RDW: 13.5 % (ref 11.0–15.0)
WBC: 5.3 10*3/uL (ref 3.8–10.8)

## 2016-06-11 LAB — TSH: TSH: 2.71 mIU/L

## 2016-06-11 LAB — HEMOGLOBIN A1C
HEMOGLOBIN A1C: 5 % (ref <5.7)
Mean Plasma Glucose: 97 mg/dL

## 2016-06-11 MED ORDER — ENALAPRIL MALEATE 2.5 MG PO TABS: ORAL_TABLET | ORAL | Status: DC

## 2016-06-11 MED ORDER — CITALOPRAM HYDROBROMIDE 40 MG PO TABS: 40.0000 mg | ORAL_TABLET | Freq: Every day | ORAL | Status: DC

## 2016-06-11 NOTE — Progress Notes (Signed)
Assessment and Plan:  Hypertension:  -Continue medication,  -monitor blood pressure at home.  -Continue DASH diet.   -Reminder to go to the ER if any CP, SOB, nausea, dizziness, severe HA, changes vision/speech, left arm numbness and tingling, and jaw pain.  Cholesterol: -Continue diet and exercise.  -Check cholesterol.   Pre-diabetes: -Continue diet and exercise.  -Check A1C  Vitamin D Def: -continue medications.   Chemical induced chronic kidney disease -increase hydration -bmet  Continue diet and meds as discussed. Further disposition pending results of labs.  HPI 40 y.o. female  presents for 3 month follow up with hypertension, hyperlipidemia, prediabetes and vitamin D.   Her blood pressure has been controlled at home, today their BP is BP: (!) 96/58 mmHg.   She does workout. She denies chest pain, shortness of breath, dizziness.  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     She is not on cholesterol medication and denies myalgias. Her cholesterol is at goal. The cholesterol last visit was:   Lab Results  Component Value Date   CHOL 158 12/12/2015   HDL 46 12/12/2015   LDLCALC 76 12/12/2015   TRIG 178* 12/12/2015   CHOLHDL 3.4 12/12/2015     She has been working on diet and exercise for prediabetes, and denies foot ulcerations, hyperglycemia, hypoglycemia , increased appetite, nausea, paresthesia of the feet, polydipsia, polyuria, visual disturbances, vomiting and weight loss. Last A1C in the office was:  Lab Results  Component Value Date   HGBA1C 5.4 12/12/2015    Patient is on Vitamin D supplement.  Lab Results  Component Value Date   VD25OH 45 12/12/2015     She reports that she is struggling to drink plenty of water daily.  She is not always near a bathroom.  She thinks that she gets in 3 water bottles per day.   No current complaints at this time.  She is up to date on her vaccines.     Current Medications:  Current Outpatient Prescriptions on File Prior to Visit  Medication Sig Dispense Refill  . citalopram (CELEXA) 40 MG tablet TAKE 1 TABLET BY MOUTH DAILY FOR MOOD/DEPRESSION 90 tablet 3  . enalapril (VASOTEC) 2.5 MG tablet Take 1 tablet daily for BP & Kidney Protection (Patient taking differently: Take 2.5 mg by mouth daily. ) 90 tablet 1  . ondansetron (ZOFRAN ODT) 4 MG disintegrating tablet Take 1 tablet (4 mg total) by mouth every 8 (eight) hours as needed for nausea. 10 tablet 0   No current facility-administered medications on file prior to visit.    Medical History:  Past Medical History  Diagnosis Date  . 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)   . AMA (advanced maternal age) primigravida 52+   . Renal insufficiency     Secondary to chemotherapy  . Hypertension   . OCD (obsessive compulsive disorder)     Allergies: No Known Allergies   Review of Systems:  Review of Systems  Constitutional: Negative for fever, chills and malaise/fatigue.  HENT: Negative for congestion, ear pain and sore throat.   Eyes: Negative.   Respiratory: Negative for cough, shortness of breath and wheezing.   Cardiovascular: Negative for chest pain, palpitations and leg swelling.  Gastrointestinal: Negative for heartburn, abdominal pain, diarrhea, constipation, blood in stool and melena.  Genitourinary: Negative.   Skin: Negative.   Neurological: Negative for dizziness, sensory change, loss of consciousness and headaches.  Psychiatric/Behavioral: Negative for depression. The patient is  not nervous/anxious and does not have insomnia.     Family history- Review and unchanged  Social history- Review and unchanged  Physical Exam: BP 96/58 mmHg  Pulse 90  Temp(Src) 98.4 F (36.9 C) (Temporal)  Resp 16  Ht 5' 3.5" (1.613 m)  Wt 176 lb (79.833 kg)  BMI 30.68 kg/m2  LMP 05/28/2016 Wt Readings from Last 3 Encounters:  06/11/16 176 lb (79.833 kg)  01/08/16 170 lb (77.111 kg)  12/12/15 178  lb 9.6 oz (81.012 kg)    General Appearance: Well nourished well developed, in no apparent distress. Eyes: PERRLA, EOMs, conjunctiva no swelling or erythema ENT/Mouth: Ear canals normal without obstruction, swelling, erythma, discharge.  TMs normal bilaterally.  Oropharynx moist, clear, without exudate, or postoropharyngeal swelling. Neck: Supple, thyroid normal,no cervical adenopathy  Respiratory: Respiratory effort normal, Breath sounds clear A&P without rhonchi, wheeze, or rale.  No retractions, no accessory usage. Cardio: RRR with no MRGs. Brisk peripheral pulses without edema.  Abdomen: Soft, + BS,  Non tender, no guarding, rebound, hernias, masses. Musculoskeletal: Full ROM, 5/5 strength, Normal gait Skin: Warm, dry without rashes, lesions, ecchymosis.  Neuro: Awake and oriented X 3, Cranial nerves intact. Normal muscle tone, no cerebellar symptoms. Psych: Normal affect, Insight and Judgment appropriate.    Starlyn Skeans, PA-C 10:38 AM [facility]

## 2016-06-27 ENCOUNTER — Other Ambulatory Visit: Payer: Self-pay | Admitting: Internal Medicine

## 2016-06-27 DIAGNOSIS — I150 Renovascular hypertension: Secondary | ICD-10-CM

## 2016-12-20 ENCOUNTER — Encounter: Payer: Self-pay | Admitting: Internal Medicine

## 2017-01-15 ENCOUNTER — Encounter: Payer: Self-pay | Admitting: Physician Assistant

## 2017-01-24 ENCOUNTER — Encounter: Payer: Self-pay | Admitting: Physician Assistant

## 2017-01-24 ENCOUNTER — Ambulatory Visit (INDEPENDENT_AMBULATORY_CARE_PROVIDER_SITE_OTHER): Payer: No Typology Code available for payment source | Admitting: Physician Assistant

## 2017-01-24 VITALS — BP 126/80 | HR 68 | Temp 97.3°F | Resp 16 | Ht 63.5 in | Wt 186.0 lb

## 2017-01-24 DIAGNOSIS — Z85830 Personal history of malignant neoplasm of bone: Secondary | ICD-10-CM

## 2017-01-24 DIAGNOSIS — E559 Vitamin D deficiency, unspecified: Secondary | ICD-10-CM

## 2017-01-24 DIAGNOSIS — E6609 Other obesity due to excess calories: Secondary | ICD-10-CM

## 2017-01-24 DIAGNOSIS — Z79899 Other long term (current) drug therapy: Secondary | ICD-10-CM | POA: Diagnosis not present

## 2017-01-24 DIAGNOSIS — M25521 Pain in right elbow: Secondary | ICD-10-CM

## 2017-01-24 DIAGNOSIS — N183 Chronic kidney disease, stage 3 unspecified: Secondary | ICD-10-CM

## 2017-01-24 DIAGNOSIS — R7309 Other abnormal glucose: Secondary | ICD-10-CM | POA: Diagnosis not present

## 2017-01-24 DIAGNOSIS — H9193 Unspecified hearing loss, bilateral: Secondary | ICD-10-CM

## 2017-01-24 DIAGNOSIS — Z6832 Body mass index (BMI) 32.0-32.9, adult: Secondary | ICD-10-CM

## 2017-01-24 DIAGNOSIS — E785 Hyperlipidemia, unspecified: Secondary | ICD-10-CM | POA: Diagnosis not present

## 2017-01-24 DIAGNOSIS — R6889 Other general symptoms and signs: Secondary | ICD-10-CM | POA: Diagnosis not present

## 2017-01-24 DIAGNOSIS — C419 Malignant neoplasm of bone and articular cartilage, unspecified: Secondary | ICD-10-CM

## 2017-01-24 DIAGNOSIS — L309 Dermatitis, unspecified: Secondary | ICD-10-CM

## 2017-01-24 DIAGNOSIS — Z0001 Encounter for general adult medical examination with abnormal findings: Secondary | ICD-10-CM

## 2017-01-24 DIAGNOSIS — I150 Renovascular hypertension: Secondary | ICD-10-CM

## 2017-01-24 LAB — CBC WITH DIFFERENTIAL/PLATELET
BASOS ABS: 0 {cells}/uL (ref 0–200)
Basophils Relative: 0 %
EOS ABS: 62 {cells}/uL (ref 15–500)
Eosinophils Relative: 1 %
HEMATOCRIT: 38.2 % (ref 35.0–45.0)
HEMOGLOBIN: 12.9 g/dL (ref 11.7–15.5)
LYMPHS ABS: 1054 {cells}/uL (ref 850–3900)
Lymphocytes Relative: 17 %
MCH: 28.4 pg (ref 27.0–33.0)
MCHC: 33.8 g/dL (ref 32.0–36.0)
MCV: 84.1 fL (ref 80.0–100.0)
MONO ABS: 558 {cells}/uL (ref 200–950)
MPV: 10.5 fL (ref 7.5–12.5)
Monocytes Relative: 9 %
NEUTROS ABS: 4526 {cells}/uL (ref 1500–7800)
NEUTROS PCT: 73 %
Platelets: 205 10*3/uL (ref 140–400)
RBC: 4.54 MIL/uL (ref 3.80–5.10)
RDW: 13.2 % (ref 11.0–15.0)
WBC: 6.2 10*3/uL (ref 3.8–10.8)

## 2017-01-24 LAB — HEPATIC FUNCTION PANEL
ALT: 14 U/L (ref 6–29)
AST: 17 U/L (ref 10–30)
Albumin: 4.1 g/dL (ref 3.6–5.1)
Alkaline Phosphatase: 54 U/L (ref 33–115)
BILIRUBIN DIRECT: 0.1 mg/dL (ref <=0.2)
BILIRUBIN INDIRECT: 0.3 mg/dL (ref 0.2–1.2)
Total Bilirubin: 0.4 mg/dL (ref 0.2–1.2)
Total Protein: 7.2 g/dL (ref 6.1–8.1)

## 2017-01-24 LAB — IRON AND TIBC
%SAT: 20 % (ref 11–50)
Iron: 78 ug/dL (ref 40–190)
TIBC: 397 ug/dL (ref 250–450)
UIBC: 319 ug/dL (ref 125–400)

## 2017-01-24 LAB — BASIC METABOLIC PANEL WITH GFR
BUN: 32 mg/dL — ABNORMAL HIGH (ref 7–25)
CALCIUM: 9 mg/dL (ref 8.6–10.2)
CO2: 20 mmol/L (ref 20–31)
CREATININE: 1.98 mg/dL — AB (ref 0.50–1.10)
Chloride: 106 mmol/L (ref 98–110)
GFR, Est African American: 36 mL/min — ABNORMAL LOW (ref >=60)
GFR, Est Non African American: 31 mL/min — ABNORMAL LOW (ref >=60)
GLUCOSE: 69 mg/dL (ref 65–99)
Potassium: 4 mmol/L (ref 3.5–5.3)
SODIUM: 137 mmol/L (ref 135–146)

## 2017-01-24 LAB — LIPID PANEL
CHOL/HDL RATIO: 4.2 ratio (ref <5.0)
CHOLESTEROL: 174 mg/dL (ref <200)
HDL: 41 mg/dL — ABNORMAL LOW (ref >50)
LDL Cholesterol: 91 mg/dL (ref <100)
Triglycerides: 211 mg/dL — ABNORMAL HIGH (ref <150)
VLDL: 42 mg/dL — AB (ref <30)

## 2017-01-24 LAB — VITAMIN B12: VITAMIN B 12: 377 pg/mL (ref 200–1100)

## 2017-01-24 LAB — TSH: TSH: 2.47 m[IU]/L

## 2017-01-24 LAB — PSA: PSA: 0.1 ng/mL

## 2017-01-24 MED ORDER — TRIAMCINOLONE ACETONIDE 0.1 % EX OINT: 1.0000 "application " | TOPICAL_OINTMENT | Freq: Two times a day (BID) | CUTANEOUS | 1 refills | Status: DC

## 2017-01-24 NOTE — Progress Notes (Signed)
Complete Physical  Assessment and Plan:  Renovascular hypertension - continue medications, DASH diet, exercise and monitor at home. Call if greater than 130/80.  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  -     Lipid panel  Abnormal glucose Discussed general issues about diabetes pathophysiology and management., Educational material distributed., Suggested low cholesterol diet., Encouraged aerobic exercise., Discussed foot care., Reminded to get yearly retinal exam. -     Hemoglobin A1c  Medication management -     Magnesium  Vitamin D deficiency -     VITAMIN D 25 Hydroxy (Vit-D Deficiency, Fractures)  History of osteosarcoma monitor  Eczema, unspecified type -     triamcinolone ointment (KENALOG) 0.1 %; Apply 1 application topically 2 (two) times daily.  Right elbow pain RICE, sleeve, tylenol, if not better will refer to ortho  Decreased hearing of both ears Will get free hearing test, if + will refer for hearing aids  Obesity with co morbidities - long discussion about weight loss, diet, and exercise   Discussed med's effects and SE's. Screening labs and tests as requested with regular follow-up as recommended. Over 40 minutes of exam, counseling, chart review, and complex, high level critical decision making was performed this visit.   HPI  41 y.o. female  presents for a complete physical and follow up for has Twin pregnancy, antepartum; Chronic kidney disease (CKD),  stage III (moderate); History of osteosarcoma; Hypertension; Hyperlipidemia; Abnormal glucose; Medication management; Vitamin D deficiency; Juxtacortical osteogenic sarcoma ([place]); and Obesity on her problem list..  Her blood pressure has been controlled at home, today their BP is BP: 126/80 She does workout, she has twins that 41 year old boy and girl.  She denies chest pain, shortness of breath, dizziness. Right handed, right elbow pain after bowling 3 months ago, still with right elbow pain, worse with reaching up high or something heavy, no numbness tingling in hand, no weakness.  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 Feels that her hearing has gotten worse, had some tinnitus and decreased hearing since chemo, but would like it tested.  She is not on cholesterol medication and denies myalgias. Her cholesterol is at goal. The cholesterol last visit was:   Lab Results  Component Value Date   CHOL 166 06/11/2016   HDL 52 06/11/2016   LDLCALC 81 06/11/2016   TRIG 163 (H) 06/11/2016   CHOLHDL 3.2 06/11/2016   She is on celexa for OCD/Anxiety and doing well.   Last A1C in the office was:  Lab Results  Component Value Date   HGBA1C 5.0 06/11/2016   Last GFR: Lab Results  Component Value Date   GFRNONAA 32 (L) 06/11/2016   Patient is on Vitamin D supplement.   Lab Results  Component Value Date   VD25OH 45 12/12/2015     BMI is Body mass index is 32.43 kg/m., she is working on  diet and exercise.  Wt Readings from Last 3 Encounters:  01/24/17 186 lb (84.4 kg)  06/11/16 176 lb (79.8 kg)  01/08/16 170 lb (77.1 kg)     Current Medications:  Current Outpatient Prescriptions on File Prior to Visit  Medication Sig Dispense Refill  . citalopram (CELEXA) 40 MG tablet Take 1 tablet (40 mg total) by mouth daily. 90 tablet 3  . enalapril (VASOTEC) 2.5 MG tablet TAKE 1 TABLET DAILY FOR BLOOD PRESSURE & KIDNEY PROTECTION 90 tablet 1   . Multiple Vitamin (MULTIVITAMIN) capsule Take 1 capsule by mouth daily.    . norethindrone-ethinyl estradiol-iron (MICROGESTIN FE,GILDESS FE,LOESTRIN FE) 1.5-30 MG-MCG tablet Take 1 tablet by mouth daily.    . Vitamin D, Cholecalciferol, 1000 units CAPS Take 4,000 Units by mouth daily.     No current facility-administered medications on file prior to visit.    Allergies:  No Known Allergies Medical History:  She has Twin pregnancy, antepartum; Chronic kidney disease (CKD), stage III (moderate); History of osteosarcoma; Hypertension; Hyperlipidemia; Abnormal glucose; Medication management; Vitamin D deficiency; Juxtacortical osteogenic sarcoma ([place]); and Obesity on her problem list.   Health Maintenance:   Immunization History  Administered Date(s) Administered  . PPD Test 11/24/2013, 11/29/2014, 12/12/2015  . Pneumococcal Polysaccharide-23 11/24/2013  . Tdap 07/11/2012   Tetanus: 2013 Pneumovax: 2014 Prevnar 13: N/A Flu vaccine: did not get Zostavax N/A  Patient's last menstrual period was 01/03/2017. Pap: 2016 Dr. Corinna Capra MGM: 2015  DEXA: N/A Colonoscopy: N/A EGD: N/A  Last Dental Exam: has been a long time Last Eye Exam: None, no changes in vision Patient Care Team: Unk Pinto, MD as PCP - General (Internal Medicine)  Surgical History:  She has a past surgical history that includes Right leg replacement; LEEP; No past surgeries; Tumor removal; and Cesarean section (09/08/2012). Family History:  Herfamily history includes Diabetes in her father; Hypertension in her father. Social History:  She reports that she quit smoking about 20 years ago. She has never used smokeless tobacco. She reports that she drinks about 1.0 oz of alcohol per week . She reports that she does not use drugs.  Review of Systems: Review of Systems  Constitutional: Negative.   HENT: Positive for hearing loss and tinnitus. Negative for ear discharge, ear pain, nosebleeds, sinus pain and sore  throat.   Eyes: Negative.   Respiratory: Negative.  Negative for stridor.   Cardiovascular: Negative.   Gastrointestinal: Negative.   Genitourinary: Negative.   Musculoskeletal: Positive for joint pain. Negative for back pain, falls, myalgias and neck pain.  Skin: Positive for itching and rash.  Neurological: Negative.  Negative for dizziness and headaches.  Endo/Heme/Allergies: Negative.   Psychiatric/Behavioral: Negative.     Physical Exam: Estimated body mass index is 32.43 kg/m as calculated from the following:   Height as of this encounter: 5' 3.5" (1.613 m).   Weight as of this encounter: 186 lb (84.4 kg). BP 126/80   Pulse 68   Temp 97.3 F (36.3 C)   Resp 16   Ht 5' 3.5" (1.613 m)   Wt 186 lb (84.4 kg)   LMP 01/03/2017   SpO2 98%   BMI 32.43 kg/m  General Appearance: Well nourished, in no apparent distress.  Eyes: PERRLA, EOMs, conjunctiva no swelling or erythema, normal fundi and vessels.  Sinuses: No Frontal/maxillary tenderness  ENT/Mouth: Ext aud canals clear, normal light reflex with TMs without erythema, bulging. Good dentition. No erythema, swelling, or exudate on post pharynx. Tonsils not swollen or erythematous.  Hearing normal.  Neck: Supple, thyroid normal. No bruits  Respiratory: Respiratory effort normal, BS equal bilaterally without rales, rhonchi, wheezing or stridor.  Cardio: RRR without murmurs, rubs or gallops. Brisk peripheral pulses without edema.  Chest: symmetric, with normal excursions and percussion.  Breasts: Symmetric, without lumps, nipple discharge, retractions.  Abdomen: Soft, nontender, no guarding, rebound, hernias, masses, or organomegaly.  Lymphatics: Non tender without lymphadenopathy.  Genitourinary: defer Musculoskeletal: Full ROM all peripheral extremities,5/5 strength, and normal gait.  Skin: erythematous scaly rash bilateral wrist. Warm, dry without rashes, lesions, ecchymosis. Neuro: Cranial nerves intact, reflexes equal  bilaterally. Normal muscle tone, no cerebellar symptoms. Sensation intact.  Psych: Awake and oriented X 3, normal affect, Insight and Judgment appropriate.   EKG: declines AORTA SCAN: declines  Vicie Mutters 2:19 PM [facility]

## 2017-01-24 NOTE — Patient Instructions (Addendum)
[facility] with no obligation # [phone] MUST BE A MEMBER Call for store hours   For your elbow can do compression sleeve day or night, ice, tylenol x 1 month if not better follow up here or with ortho.   Drink 80-100 oz of water a day, drink water BEFORE coffee Eat breakfast, make it healthy, eggs on whole grain toast, hard boiled eggs etc.   Try to eliminate diary   Simple math prevails.    1st - exercise does not produce significant weight loss - at best one converts fat into muscle , "bulks up", loses inches, but usually stays "weight neutral"     2nd - think of your body weightas a check book: If you eat more calories than you burn up - you save money or gain weight .... Or if you spend more money than you put in the check book, ie burn up more calories than you eat, then you lose weight     3rd - if you walk or run 1 mile, you burn up 100 calories - you have to burn up 3,500 calories to lose 1 pound, ie you have to walk/run 35 miles to lose 1 measly pound. So if you want to lose 10 #, then you have to walk/run 350 miles, so.... clearly exercise is not the solution.     4. So if you consume 1,500 calories, then you have to burn up the equivalent of 15 miles to stay weight neutral - It also stands to reason that if you consume 1,500 cal/day and don't lose weight, then you must be burning up about 1,500 cals/day to stay weight neutral.     5. If you really want to lose weight, you must cut your calorie intake 300 calories /day and at that rate you should lose about 1 # every 3 days.   6. Please purchase Dr Fara Olden Fuhrman's book(s) "The End of Dieting" & "Eat to Live" . It has some great concepts and recipes.      We want weight loss that will last so you should lose 1-2 pounds a week.  THAT IS IT! Please pick THREE things a month to change. Once it is a habit check off the item. Then pick another three items off the list to become habits.  If you are  already doing a habit on the list GREAT!  Cross that item off! o Don't drink your calories. Ie, alcohol, soda, fruit juice, and sweet tea.  o Drink more water. Drink a glass when you feel hungry or before each meal.  o Eat breakfast - Complex carb and protein (likeDannon light and fit yogurt, oatmeal, fruit, eggs, [place] bacon). o Measure your cereal.  Eat no more than one cup a day. (ie [place]) o Eat an apple a day. o Add a vegetable a day. o Try a new vegetable a month. o Use Pam! Stop using oil or butter to cook. o Don't finish your plate or use smaller plates. o Share your dessert. o Eat sugar free Jello for dessert or frozen grapes. o Don't eat 2-3 hours before bed. o Switch to whole wheat bread, pasta, and brown rice. o Make healthier choices when you eat out. No fries! o Pick baked chicken, NOT fried. o Don't forget to SLOW DOWN when you eat. It is not going anywhere.  o Take the stairs. o Park far away in the parking lot o [employer] (or weights) for  10 minutes while watching TV. o Walk at work for 10 minutes during break. o Walk outside 1 time a week with your friend, kids, dog, or significant other. o Start a walking group at [place] as much as you can tolerate.  o Keep a food diary. o Weigh yourself daily. o Walk for 15 minutes 3 days per week. o Cook at home more often and eat out less.  If life happens and you go back to old habits, it is okay.  Just start over. You can do it!   If you experience chest pain, get short of breath, or tired during the exercise, please stop immediately and inform your doctor.    Hand Dermatitis Introduction Hand dermatitis is a skin condition. It causes small, itchy, raised dots or fluid-filled blisters to form on the palms of the hands. This condition may also be called hand eczema. Follow these instructions at home:  Take or apply over-the-counter and prescription medicines only as told by your doctor.  If you  were prescribed an antibiotic medicine, use it as told by your doctor. Do not stop using the antibiotic even if you start to feel better.  Avoid washing your hands more often than you need to.  Avoid using harsh chemicals on your hands.  Wear gloves that protect your hands when you handle products that can bother (irritate) your skin.  Keep all follow-up visits as told by your doctor. This is important. Contact a doctor if:  Your rash is not better after one week of treatment.  Your rash is red.  Your rash is tender.  Your rash has pus coming from it.  Your rash spreads. This information is not intended to replace advice given to you by your health care provider. Make sure you discuss any questions you have with your health care provider. Document Released: 02/13/2010 Document Revised: 04/26/2016 Document Reviewed: 06/03/2015  2017 Elsevier

## 2017-01-25 LAB — VITAMIN D 25 HYDROXY (VIT D DEFICIENCY, FRACTURES): Vit D, 25-Hydroxy: 39 ng/mL (ref 30–100)

## 2017-01-25 LAB — URINALYSIS, ROUTINE W REFLEX MICROSCOPIC
BILIRUBIN URINE: NEGATIVE
Glucose, UA: NEGATIVE
Hgb urine dipstick: NEGATIVE
KETONES UR: NEGATIVE
Leukocytes, UA: NEGATIVE
Nitrite: NEGATIVE
PH: 6 (ref 5.0–8.0)
Protein, ur: NEGATIVE
SPECIFIC GRAVITY, URINE: 1.012 (ref 1.001–1.035)

## 2017-01-25 LAB — HEMOGLOBIN A1C
Hgb A1c MFr Bld: 4.8 % (ref <5.7)
MEAN PLASMA GLUCOSE: 91 mg/dL

## 2017-01-25 LAB — MAGNESIUM: Magnesium: 1.9 mg/dL (ref 1.5–2.5)

## 2017-01-25 LAB — MICROALBUMIN / CREATININE URINE RATIO
Creatinine, Urine: 76 mg/dL (ref 20–320)
Microalb Creat Ratio: 55 mcg/mg creat — ABNORMAL HIGH (ref <30)
Microalb, Ur: 4.2 mg/dL

## 2017-01-25 LAB — INSULIN, RANDOM: INSULIN: 12 u[IU]/mL (ref 2.0–19.6)

## 2017-06-13 ENCOUNTER — Other Ambulatory Visit: Payer: Self-pay | Admitting: Obstetrics and Gynecology

## 2017-06-14 LAB — CYTOLOGY - PAP

## 2017-07-08 ENCOUNTER — Other Ambulatory Visit: Payer: Self-pay | Admitting: Internal Medicine

## 2017-07-08 DIAGNOSIS — I150 Renovascular hypertension: Secondary | ICD-10-CM

## 2017-07-15 ENCOUNTER — Other Ambulatory Visit: Payer: Self-pay

## 2017-07-15 MED ORDER — CITALOPRAM HYDROBROMIDE 40 MG PO TABS: 40.0000 mg | ORAL_TABLET | Freq: Every day | ORAL | 0 refills | Status: DC

## 2017-07-25 ENCOUNTER — Ambulatory Visit: Payer: Self-pay | Admitting: Physician Assistant

## 2017-08-04 ENCOUNTER — Other Ambulatory Visit: Payer: Self-pay | Admitting: Internal Medicine

## 2017-08-04 DIAGNOSIS — I150 Renovascular hypertension: Secondary | ICD-10-CM

## 2017-10-12 ENCOUNTER — Other Ambulatory Visit: Payer: Self-pay | Admitting: Physician Assistant

## 2017-11-11 ENCOUNTER — Other Ambulatory Visit: Payer: Self-pay | Admitting: Internal Medicine

## 2017-12-09 ENCOUNTER — Other Ambulatory Visit: Payer: Self-pay | Admitting: Internal Medicine

## 2017-12-09 MED ORDER — AMOXICILLIN 500 MG PO CAPS
ORAL_CAPSULE | ORAL | 0 refills | Status: DC
Start: 2017-12-09 — End: 2018-06-14

## 2017-12-25 NOTE — Progress Notes (Signed)
Assessment and Plan:  Jackie Wilson was seen today for rash.  Diagnoses and all orders for this visit:  Eczema of both hands Switch soaps - non antibacterial hand soap, non-foaming, gentle soap Moisturize after each hand washing using lanolin cream, aveeno or eucerine or other gentle agent Retry triamcinolone after changing handwashing products Eucrysa samples provided to try after all of the above If symptoms not improving in 4-6 weeks, will provide referral to dermatology  Further disposition pending results of labs. Discussed med's effects and SE's.   Over 15 minutes of exam, counseling, chart review, and critical decision making was performed.   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 She has mentioned this to a provider in the past and was given triamcinolone prescription, but she reports this has not resolved the issue. She reports no new products at home, but endorses she uses a foaming antibacterial soap for handwashing and does not apply any moisturizer to her hands. She endorses itchiness without any discharges.   She denies "rash" of other areas, fever/chills/fatigue/malaise, myalgias, arthralgias, chest pain, fatigue, dyspena, HA.   Past Medical History:  Diagnosis Date  . AMA (advanced maternal age) primigravida 33+   . Hypertension   . OCD (obsessive compulsive disorder)   . Osteosarcoma ([place])   . Renal insufficiency    Secondary to chemotherapy     No Known Allergies  Current Outpatient Medications on File Prior to Visit  Medication Sig  . citalopram (CELEXA) 40 MG  tablet TAKE ONE TABLET BY MOUTH DAILY  . enalapril (VASOTEC) 2.5 MG tablet TAKE ONE TABLET BY MOUTH DAILY FOR BLOOD PRESSURE AND KIDNEY PROTECTION  . Multiple Vitamin (MULTIVITAMIN) capsule Take 1 capsule by mouth daily.  . norethindrone-ethinyl estradiol-iron (MICROGESTIN FE,GILDESS FE,LOESTRIN FE) 1.5-30 MG-MCG tablet Take 1 tablet by mouth daily.  Marland Kitchen triamcinolone ointment (KENALOG) 0.1 % Apply 1 application topically 2 (two) times daily.  . Vitamin D, Cholecalciferol, 1000 units CAPS Take 4,000 Units by mouth daily.  Marland Kitchen amoxicillin (AMOXIL) 500 MG capsule Take 2 tablets 1 to 2 hours before Dental Procedure (Patient not taking: Reported on 12/26/2017)   No current facility-administered medications on file prior to visit.     ROS: all negative except above.   Physical Exam:  BP 106/74   Pulse 81   Temp (!) 97.4 F (36.3 C)   Ht 5' 3.5" (1.613 m)   Wt 179 lb (81.2 kg)   SpO2 99%   BMI 31.21 kg/m   General Appearance: Well nourished, in no apparent distress. Eyes: PERRLA, conjunctiva no swelling or erythema ENT/Mouth: Ext aud canals clear, TMs without erythema, bulging. No erythema, swelling, or exudate on post pharynx.  Tonsils not swollen or erythematous. Hearing normal.  Neck: Supple.  Respiratory: Respiratory effort normal, BS equal bilaterally without rales, rhonchi, wheezing or stridor.  Cardio: RRR with no MRGs. Brisk peripheral pulses without edema.  Abdomen: Soft, + BS.  Non tender, no guarding. Lymphatics: Non tender without lymphadenopathy.  Musculoskeletal: normal gait.  Skin: Warm, dry - bilateral hands with scattered patchy areas with erythematous bases and lichenified/flaking areas notably worse  over areas of flexion.  Psych: Awake and oriented X 3, normal affect, Insight and Judgment appropriate.     Izora Ribas, NP 9:41 AM [facility]

## 2017-12-26 ENCOUNTER — Ambulatory Visit: Payer: No Typology Code available for payment source | Admitting: Adult Health

## 2017-12-26 ENCOUNTER — Encounter: Payer: Self-pay | Admitting: Adult Health

## 2017-12-26 VITALS — BP 106/74 | HR 81 | Temp 97.4°F | Ht 63.5 in | Wt 179.0 lb

## 2017-12-26 DIAGNOSIS — L309 Dermatitis, unspecified: Secondary | ICD-10-CM

## 2017-12-26 NOTE — Patient Instructions (Addendum)
Stop using foamy/antibacterial soap  Use moisturizing, gentle soap for handwashing - moisturize using a gentle moisturizer such as aveeno or a lanolin cream  Can try eucrisa - if very helpful let me know and I will send in a prescription  Eczema Eczema is a broad term for a group of skin conditions that cause skin to become rough and inflamed. Each type of eczema has different triggers, symptoms, and treatments. Eczema of any type is usually itchy and symptoms range from mild to severe. Eczema and its symptoms are not spread from person to person (are not contagious). It can appear on different parts of the body at different times. Your eczema may not look the same as someone else's eczema. What are the types of eczema? Atopic dermatitis This is a long-term (chronic) skin disease that keeps coming back (recurring). Usual symptoms are dry skin and small, solid pimples that may swell and leak fluid (weep). Contact dermatitis This happens when something irritates the skin and causes a rash. The irritation can come from substances that you are allergic to (allergens), such as poison ivy, chemicals, or medicines that were applied to your skin. Dyshidrotic eczema This is a form of eczema on the hands and feet. It shows up as very itchy, fluid-filled blisters. It can affect people of any age, but is more common before age 20. Hand eczema This causes very itchy areas of skin on the palms and sides of the hands and fingers. This type of eczema is common in industrial jobs where you may be exposed to many different types of irritants. Lichen simplex chronicus This type of eczema occurs when a person constantly scratches one area of the body. Repeated scratching of the area leads to thickened skin (lichenification). Lichen simplex chronicus can occur along with other types of eczema. It is more common in adults, but may be seen in children as well. Nummular eczema This is a common type of eczema. It has  no known cause. It typically causes a red, circular, crusty lesion (plaque) that may be itchy. Scratching may become a habit and can cause bleeding. Nummular eczema occurs most often in people of middle-age or older. It most often affects the hands. Seborrheic dermatitis This is a common skin disease that mainly affects the scalp. It may also affect any oily areas of the body, such as the face, sides of nose, eyebrows, ears, eyelids, and chest. It is marked by small scaling and redness of the skin (erythema). This can affect people of all ages. In infants, this condition is known as Chartered certified accountant." Stasis dermatitis This is a common skin disease that usually appears on the legs and feet. It most often occurs in people who have a condition that prevents blood from being pumped through the veins in the legs (chronic venous insufficiency). Stasis dermatitis is a chronic condition that needs long-term management. How is eczema diagnosed? Your health care provider will examine your skin and review your medical history. He or she may also give you skin patch tests. These tests involve taking patches that contain possible allergens and placing them on your back. He or she will then check in a few days to see if an allergic reaction occurred. What are the common treatments? Treatment for eczema is based on the type of eczema you have. Hydrocortisone steroid medicine can relieve itching quickly and help reduce inflammation. This medicine may be prescribed or obtained over-the-counter, depending on the strength of the medicine that is needed. Follow these  instructions at home:  Take over-the-counter and prescription medicines only as told by your health care provider.  Use creams or ointments to moisturize your skin. Do not use lotions.  Learn what triggers or irritates your symptoms. Avoid these things.  Treat symptom flare-ups quickly.  Do not itch your skin. This can make your rash worse.  Keep all  follow-up visits as told by your health care provider. This is important. Where to find more information:  The American Academy of Dermatology: http://jones-macias.info/  The National Eczema Association: www.nationaleczema.org Contact a health care provider if:  You have serious itching, even with treatment.  You regularly scratch your skin until it bleeds.  Your rash looks different than usual.  Your skin is painful, swollen, or more red than usual.  You have a fever. Summary  There are eight general types of eczema. Each type has different triggers.  Eczema of any type causes itching that may range from mild to severe.  Treatment varies based on the type of eczema you have. Hydrocortisone steroid medicine can help with itching and inflammation.  Protecting your skin is the best way to prevent eczema. Use moisturizers and lotions. Avoid triggers and irritants, and treat flare-ups quickly. This information is not intended to replace advice given to you by your health care provider. Make sure you discuss any questions you have with your health care provider. Document Released: 04/04/2017 Document Revised: 04/04/2017 Document Reviewed: 04/04/2017 Elsevier Interactive Patient Education  2018 [employer].

## 2017-12-30 ENCOUNTER — Encounter: Payer: Self-pay | Admitting: Internal Medicine

## 2018-01-27 ENCOUNTER — Encounter: Payer: Self-pay | Admitting: Physician Assistant

## 2018-01-27 NOTE — Progress Notes (Signed)
Complete Physical  Assessment and Plan:  Renovascular hypertension - continue medications, DASH diet, exercise and monitor at home. Call if greater than 130/80.  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  -     Lipid panel  Abnormal glucose Discussed general issues about diabetes pathophysiology and management., Educational material distributed., Suggested low cholesterol diet., Encouraged aerobic exercise., Discussed foot care., Reminded to get yearly retinal exam. -     Hemoglobin A1c  Medication management -     Magnesium  Vitamin D deficiency -     VITAMIN D 25 Hydroxy (Vit-D Deficiency, Fractures)  History of osteosarcoma monitor  Obesity with co morbidities - long discussion about weight loss, diet, and exercise   Discussed med's effects and SE's. Screening labs and tests as requested with regular follow-up as recommended. Over 40 minutes of exam, counseling, chart review, and complex, high level critical decision making was performed this visit.   HPI  42 y.o. female  presents for a complete physical and follow up for has Chronic kidney disease (CKD), stage III (moderate) ([place]); History of osteosarcoma; Hypertension; Hyperlipidemia; Abnormal glucose; Medication management; Vitamin D deficiency; Obesity; and Eczema of both hands on their problem list..  Her blood pressure has been controlled at home, today their BP is BP: 104/72 She does workout, she has twins that 57, 93 in  Oct year old boy and girl.  She denies chest pain, shortness of breath, dizziness. 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 Feels that her hearing has gotten worse, had some tinnitus and decreased hearing since chemo, but would like it tested.  She is not on cholesterol medication and denies myalgias. Her cholesterol is at goal. The cholesterol last visit was:   Lab Results  Component Value Date   CHOL 174 01/24/2017   HDL 41 (L) 01/24/2017   LDLCALC 91 01/24/2017   TRIG 211 (H) 01/24/2017   CHOLHDL 4.2 01/24/2017   She is on celexa for OCD/Anxiety and doing well.   Last A1C in the office was:  Lab Results  Component Value Date   HGBA1C 4.8 01/24/2017   Last GFR: Lab Results  Component Value Date   GFRNONAA 31 (L) 01/24/2017   Patient is on Vitamin D supplement.   Lab Results  Component Value Date   VD25OH 39 01/24/2017     BMI is Body mass index is 31.49 kg/m., she is working on diet and exercise.  Wt Readings from Last 3 Encounters:  01/28/18 180 lb 9.6 oz (81.9 kg)  12/26/17 179 lb (81.2 kg)  01/24/17 186 lb (84.4 kg)     Current Medications:  Current Outpatient Medications on File Prior to Visit  Medication Sig Dispense Refill  . citalopram (CELEXA) 40 MG tablet TAKE ONE TABLET BY MOUTH DAILY 90 tablet 0  . enalapril (VASOTEC) 2.5 MG tablet TAKE ONE TABLET BY MOUTH DAILY FOR BLOOD PRESSURE AND  KIDNEY PROTECTION 90 tablet 1  . Multiple Vitamin (MULTIVITAMIN) capsule Take 1 capsule by mouth daily.    . norethindrone-ethinyl estradiol-iron (MICROGESTIN FE,GILDESS FE,LOESTRIN FE) 1.5-30 MG-MCG tablet Take 1 tablet by mouth daily.    Marland Kitchen triamcinolone ointment (KENALOG) 0.1 % Apply 1 application topically 2 (two) times daily. 80 g 1  . Vitamin D, Cholecalciferol, 1000 units CAPS Take 4,000 Units by mouth daily.    Marland Kitchen amoxicillin (AMOXIL) 500 MG capsule Take 2 tablets 1 to 2 hours before Dental Procedure (Patient not  taking: Reported on 01/28/2018) 10 capsule 0   No current facility-administered medications on file prior to visit.    Allergies:  No Known Allergies   Medical History:  She has Chronic kidney disease (CKD), stage III (moderate) ([place]); History of osteosarcoma; Hypertension; Hyperlipidemia; Abnormal glucose; Medication management; Vitamin D deficiency; Obesity; and Eczema of both hands on their problem list.   Health Maintenance:   Immunization History  Administered Date(s) Administered  . Influenza-Unspecified 10/06/2017  . PPD Test 11/24/2013, 11/29/2014, 12/12/2015  . Pneumococcal Polysaccharide-23 11/24/2013  . Tdap 07/11/2012   Tetanus: 2013 Pneumovax: 2014 Prevnar 13: N/A Flu vaccine: 2018 Zostavax N/A  No LMP recorded. Pap: 2018 Dr. Corinna Capra MGM: 06/2017  DEXA: N/A Colonoscopy: N/A EGD: N/A  Last Dental Exam: last month she went Last Eye Exam: None, no changes in vision Patient Care Team: Unk Pinto, MD as PCP - General (Internal Medicine)  Surgical History:  She has a past surgical history that includes Right leg replacement; LEEP; No past surgeries; Tumor removal; and Cesarean section (09/08/2012). Family History:  Herfamily history includes Diabetes in her father; Hypertension in her father. Social History:  She reports that she quit smoking about 21 years ago. she has never used smokeless tobacco. She reports that she drinks about 1.0 oz of alcohol per week. She reports that she does not use drugs.  Review of Systems: Review of Systems  Constitutional: Negative.   HENT: Positive for hearing loss and tinnitus. Negative for ear discharge, ear pain, nosebleeds, sinus pain and sore throat.   Eyes: Negative.   Respiratory: Negative.  Negative for stridor.   Cardiovascular: Negative.   Gastrointestinal: Negative.   Genitourinary: Negative.   Musculoskeletal: Negative for back pain, falls, joint pain, myalgias and neck pain.  Skin: Negative for itching and  rash.  Neurological: Negative.  Negative for dizziness and headaches.  Endo/Heme/Allergies: Negative.   Psychiatric/Behavioral: Negative.     Physical Exam: Estimated body mass index is 31.49 kg/m as calculated from the following:   Height as of this encounter: 5' 3.5" (1.613 m).   Weight as of this encounter: 180 lb 9.6 oz (81.9 kg). BP 104/72   Pulse 96   Temp 97.7 F (36.5 C)   Ht 5' 3.5" (1.613 m)   Wt 180 lb 9.6 oz (81.9 kg)   SpO2 98%   BMI 31.49 kg/m  General Appearance: Well nourished, in no apparent distress.  Eyes: PERRLA, EOMs, conjunctiva no swelling or erythema, normal fundi and vessels.  Sinuses: No Frontal/maxillary tenderness  ENT/Mouth: Ext aud canals clear, normal light reflex with TMs without erythema, bulging. Good dentition. No erythema, swelling, or exudate on post pharynx. Tonsils not swollen or erythematous. Hearing normal.  Neck: Supple, thyroid normal. No bruits  Respiratory: Respiratory effort normal, BS equal bilaterally without rales, rhonchi, wheezing or stridor.  Cardio: RRR without murmurs, rubs or gallops. Brisk peripheral pulses without edema.  Chest: symmetric, with normal excursions and percussion.  Breasts: Symmetric,  without lumps, nipple discharge, retractions.  Abdomen: Soft, nontender, no guarding, rebound, hernias, masses, or organomegaly.  Lymphatics: Non tender without lymphadenopathy.  Genitourinary: defer Musculoskeletal: Full ROM all peripheral extremities,5/5 strength, and normal gait.  Skin: erythematous scaly rash bilateral wrist. Warm, dry without rashes, lesions, ecchymosis. Neuro: Cranial nerves intact, reflexes equal bilaterally. Normal muscle tone, no cerebellar symptoms. Sensation intact.  Psych: Awake and oriented X 3, normal affect, Insight and Judgment appropriate.   EKG: declines AORTA SCAN: declines  Vicie Mutters 2:11 PM [facility]

## 2018-01-28 ENCOUNTER — Ambulatory Visit: Payer: No Typology Code available for payment source | Admitting: Physician Assistant

## 2018-01-28 ENCOUNTER — Encounter: Payer: Self-pay | Admitting: Physician Assistant

## 2018-01-28 VITALS — BP 104/72 | HR 96 | Temp 97.7°F | Ht 63.5 in | Wt 180.6 lb

## 2018-01-28 DIAGNOSIS — E559 Vitamin D deficiency, unspecified: Secondary | ICD-10-CM

## 2018-01-28 DIAGNOSIS — Z0000 Encounter for general adult medical examination without abnormal findings: Secondary | ICD-10-CM | POA: Diagnosis not present

## 2018-01-28 DIAGNOSIS — N183 Chronic kidney disease, stage 3 unspecified: Secondary | ICD-10-CM

## 2018-01-28 DIAGNOSIS — C419 Malignant neoplasm of bone and articular cartilage, unspecified: Secondary | ICD-10-CM

## 2018-01-28 DIAGNOSIS — Z79899 Other long term (current) drug therapy: Secondary | ICD-10-CM

## 2018-01-28 DIAGNOSIS — I150 Renovascular hypertension: Secondary | ICD-10-CM

## 2018-01-28 DIAGNOSIS — E785 Hyperlipidemia, unspecified: Secondary | ICD-10-CM

## 2018-01-28 DIAGNOSIS — R7309 Other abnormal glucose: Secondary | ICD-10-CM

## 2018-01-28 DIAGNOSIS — Z85830 Personal history of malignant neoplasm of bone: Secondary | ICD-10-CM

## 2018-01-28 NOTE — Patient Instructions (Signed)
Can take zantac 150-300 mg OR pepcid 20 or 40mg  at night for 2 weeks, then you can stop or continue as needed.   Avoid alcohol, spicy foods, NSAIDS (aleve, ibuprofen) at this time. See foods below.   Food Choices for Gastroesophageal Reflux Disease When you have gastroesophageal reflux disease (GERD), the foods you eat and your eating habits are very important. Choosing the right foods can help ease the discomfort of GERD. WHAT GENERAL GUIDELINES DO I NEED TO FOLLOW?  Choose fruits, vegetables, whole grains, low-fat dairy products, and low-fat meat, fish, and poultry.  Limit fats such as oils, salad dressings, butter, nuts, and avocado.  Keep a food diary to identify foods that cause symptoms.  Avoid foods that cause reflux. These may be different for different people.  Eat frequent small meals instead of three large meals each day.  Eat your meals slowly, in a relaxed setting.  Limit fried foods.  Cook foods using methods other than frying.  Avoid drinking alcohol.  Avoid drinking large amounts of liquids with your meals.  Avoid bending over or lying down until 2-3 hours after eating. WHAT FOODS ARE NOT RECOMMENDED? The following are some foods and drinks that may worsen your symptoms: Vegetables Tomatoes. Tomato juice. Tomato and spaghetti sauce. Chili peppers. Onion and garlic. Horseradish. Fruits Oranges, grapefruit, and lemon (fruit and juice). Meats High-fat meats, fish, and poultry. This includes hot dogs, ribs, ham, sausage, salami, and bacon. Dairy Whole milk and chocolate milk. Sour cream. Cream. Butter. Ice cream. Cream cheese.  Beverages Coffee and tea, with or without caffeine. Carbonated beverages or energy drinks. Condiments Hot sauce. Barbecue sauce.  Sweets/Desserts Chocolate and cocoa. Donuts. Peppermint and spearmint. Fats and Oils High-fat foods, including [place] fries and potato chips. Other Vinegar. Strong spices, such as black pepper, white  pepper, red pepper, cayenne, curry powder, cloves, ginger, and chili powder.

## 2018-01-29 LAB — URINALYSIS, ROUTINE W REFLEX MICROSCOPIC
BACTERIA UA: NONE SEEN /HPF
Bilirubin Urine: NEGATIVE
Glucose, UA: NEGATIVE
HGB URINE DIPSTICK: NEGATIVE
Hyaline Cast: NONE SEEN /LPF
KETONES UR: NEGATIVE
Nitrite: NEGATIVE
PH: 6.5 (ref 5.0–8.0)
Protein, ur: NEGATIVE
RBC / HPF: NONE SEEN /HPF (ref 0–2)
SPECIFIC GRAVITY, URINE: 1.014 (ref 1.001–1.03)

## 2018-01-29 LAB — BASIC METABOLIC PANEL WITH GFR
BUN / CREAT RATIO: 18 (calc) (ref 6–22)
BUN: 35 mg/dL — AB (ref 7–25)
CO2: 20 mmol/L (ref 20–32)
CREATININE: 1.97 mg/dL — AB (ref 0.50–1.10)
Calcium: 9.4 mg/dL (ref 8.6–10.2)
Chloride: 106 mmol/L (ref 98–110)
GFR, Est African American: 36 mL/min/{1.73_m2} — ABNORMAL LOW (ref >=60)
GFR, Est Non African American: 31 mL/min/{1.73_m2} — ABNORMAL LOW (ref >=60)
GLUCOSE: 68 mg/dL (ref 65–99)
POTASSIUM: 4.2 mmol/L (ref 3.5–5.3)
Sodium: 138 mmol/L (ref 135–146)

## 2018-01-29 LAB — CBC WITH DIFFERENTIAL/PLATELET
Basophils Absolute: 22 cells/uL (ref 0–200)
Basophils Relative: 0.4 %
EOS PCT: 1.4 %
Eosinophils Absolute: 77 cells/uL (ref 15–500)
HCT: 38.2 % (ref 35.0–45.0)
HEMOGLOBIN: 13.1 g/dL (ref 11.7–15.5)
Lymphs Abs: 1051 cells/uL (ref 850–3900)
MCH: 28.3 pg (ref 27.0–33.0)
MCHC: 34.3 g/dL (ref 32.0–36.0)
MCV: 82.5 fL (ref 80.0–100.0)
MONOS PCT: 8.5 %
MPV: 11.5 fL (ref 7.5–12.5)
NEUTROS ABS: 3883 {cells}/uL (ref 1500–7800)
Neutrophils Relative %: 70.6 %
PLATELETS: 214 10*3/uL (ref 140–400)
RBC: 4.63 10*6/uL (ref 3.80–5.10)
RDW: 12.1 % (ref 11.0–15.0)
TOTAL LYMPHOCYTE: 19.1 %
WBC mixed population: 468 cells/uL (ref 200–950)
WBC: 5.5 10*3/uL (ref 3.8–10.8)

## 2018-01-29 LAB — LIPID PANEL
CHOL/HDL RATIO: 4.6 (calc) (ref <5.0)
Cholesterol: 193 mg/dL (ref <200)
HDL: 42 mg/dL — ABNORMAL LOW (ref >50)
LDL CHOLESTEROL (CALC): 120 mg/dL — AB
NON-HDL CHOLESTEROL (CALC): 151 mg/dL — AB (ref <130)
Triglycerides: 184 mg/dL — ABNORMAL HIGH (ref <150)

## 2018-01-29 LAB — HEPATIC FUNCTION PANEL
AG Ratio: 1.4 (calc) (ref 1.0–2.5)
ALT: 16 U/L (ref 6–29)
AST: 16 U/L (ref 10–30)
Albumin: 4.2 g/dL (ref 3.6–5.1)
Alkaline phosphatase (APISO): 61 U/L (ref 33–115)
BILIRUBIN DIRECT: 0.1 mg/dL (ref 0.0–0.2)
BILIRUBIN TOTAL: 0.6 mg/dL (ref 0.2–1.2)
GLOBULIN: 3 g/dL (ref 1.9–3.7)
Indirect Bilirubin: 0.5 mg/dL (calc) (ref 0.2–1.2)
Total Protein: 7.2 g/dL (ref 6.1–8.1)

## 2018-01-29 LAB — TSH: TSH: 2.67 mIU/L

## 2018-01-29 LAB — VITAMIN D 25 HYDROXY (VIT D DEFICIENCY, FRACTURES): VIT D 25 HYDROXY: 38 ng/mL (ref 30–100)

## 2018-01-29 LAB — MICROALBUMIN / CREATININE URINE RATIO
CREATININE, URINE: 85 mg/dL (ref 20–275)
MICROALB UR: 4.1 mg/dL
Microalb Creat Ratio: 48 mcg/mg creat — ABNORMAL HIGH (ref <30)

## 2018-01-29 LAB — MAGNESIUM: Magnesium: 2 mg/dL (ref 1.5–2.5)

## 2018-02-08 ENCOUNTER — Other Ambulatory Visit: Payer: Self-pay | Admitting: Internal Medicine

## 2018-02-08 DIAGNOSIS — I150 Renovascular hypertension: Secondary | ICD-10-CM

## 2018-06-14 ENCOUNTER — Other Ambulatory Visit: Payer: Self-pay | Admitting: Internal Medicine

## 2018-08-07 ENCOUNTER — Other Ambulatory Visit: Payer: Self-pay | Admitting: Internal Medicine

## 2018-08-07 DIAGNOSIS — I150 Renovascular hypertension: Secondary | ICD-10-CM

## 2018-09-06 ENCOUNTER — Other Ambulatory Visit: Payer: Self-pay | Admitting: Adult Health

## 2018-09-06 DIAGNOSIS — I150 Renovascular hypertension: Secondary | ICD-10-CM

## 2019-01-27 NOTE — Progress Notes (Signed)
Complete Physical  Assessment and Plan:  Renovascular hypertension - continue medications, DASH diet, exercise and monitor at home. Call if greater than 130/80.  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  -     Lipid panel  Abnormal glucose Discussed general issues about diabetes pathophysiology and management., Educational material distributed., Suggested low cholesterol diet., Encouraged aerobic exercise., Discussed foot care., Reminded to get yearly retinal exam. -     Hemoglobin A1c  Medication management -     Magnesium  Vitamin D deficiency -     VITAMIN D 25 Hydroxy (Vit-D Deficiency, Fractures)  History of osteosarcoma monitor  Obesity with co morbidities - long discussion about weight loss, diet, and exercise   Discussed med's effects and SE's. Screening labs and tests as requested with regular follow-up as recommended. Over 40 minutes of exam, counseling, chart review, and complex, high level critical decision making was performed this visit.   HPI  43 y.o. female  presents for a complete physical and follow up for has Chronic kidney disease (CKD), stage III (moderate) ([place]); History of osteosarcoma; Hypertension; Hyperlipidemia; Abnormal glucose; Medication management; Vitamin D deficiency; Obesity; and Eczema of both hands on their problem list..  Her blood pressure has been controlled at home, on enalapril 2.5, today their BP is BP: 110/64 She does workout, she has  twins that, 107 in Oct year old boy and girl.  She denies chest pain, shortness of breath, dizziness. 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  Feels that her hearing has gotten worse, had some tinnitus and decreased hearing since chemo, got tested and states not ready for hearing aids yet.  She is not on cholesterol medication and denies myalgias. Her cholesterol is at goal. The cholesterol last visit was:   Lab Results  Component Value Date   CHOL 193 01/28/2018   HDL 42 (L) 01/28/2018   LDLCALC 120 (H) 01/28/2018   TRIG 184 (H) 01/28/2018   CHOLHDL 4.6 01/28/2018   She is on celexa for OCD/Anxiety and doing well.   Last A1C in the office was:  Lab Results  Component Value Date   HGBA1C 4.8 01/24/2017   Last GFR: Lab Results  Component Value Date   GFRNONAA 31 (L) 01/28/2018   Patient is on Vitamin D supplement, not taking it.    Lab Results  Component Value Date   VD25OH 38 01/28/2018     BMI is Body mass index is 28.84 kg/m., she is working on diet and exercise. She is walking more and doing step counter from work.  Wt Readings from Last 3 Encounters:  01/28/19 168 lb (76.2 kg)  01/28/18 180 lb 9.6 oz (81.9 kg)  12/26/17 179 lb (81.2 kg)     Current Medications:  Current Outpatient Medications on File Prior to Visit  Medication Sig Dispense Refill  . citalopram (CELEXA) 40 MG tablet TAKE ONE TABLET  BY MOUTH DAILY 90 tablet 1  . enalapril (VASOTEC) 2.5 MG tablet TAKE ONE TABLET BY MOUTH DAILY FOR BLOOD PRESSURE AND KIDNEY PROTECTION 90 tablet 1  . norethindrone-ethinyl estradiol-iron (MICROGESTIN FE,GILDESS FE,LOESTRIN FE) 1.5-30 MG-MCG tablet Take 1 tablet by mouth daily.     No current facility-administered medications on file prior to visit.    Allergies:  No Known Allergies   Medical History:  She has Chronic kidney disease (CKD), stage III (moderate) ([place]); History of osteosarcoma; Hypertension;  Hyperlipidemia; Abnormal glucose; Medication management; Vitamin D deficiency; Obesity; and Eczema of both hands on their problem list.   Health Maintenance:   Immunization History  Administered Date(s) Administered  . Influenza-Unspecified 10/06/2017  . PPD Test 11/24/2013, 11/29/2014, 12/12/2015  . Pneumococcal Polysaccharide-23 11/24/2013  . Tdap 07/11/2012   Tetanus: 2013 Pneumovax: 2014 Prevnar 13: N/A Flu vaccine: 2018 Zostavax N/A  Patient's last menstrual period was 01/09/2019. Pap: 2019 Dr. Corinna Capra MGM: 07/2018 at Dr. Corinna Capra DEXA: N/A Colonoscopy: N/A EGD: N/A  Last Dental Exam: last month she went Last Eye Exam: None, no changes in vision Patient Care Team: Unk Pinto, MD as PCP - General (Internal Medicine)  Surgical History:  She has a past surgical history that includes Right leg replacement; LEEP; No past surgeries; Tumor removal; and Cesarean section (09/08/2012). Family History:  Herfamily history includes Diabetes in her father; Hypertension in her father. Social History:  She reports that she quit smoking about 22 years ago. She has never used smokeless tobacco. She reports current alcohol use of about 2.0 standard drinks of alcohol per week. She reports that she does not use drugs.  Review of Systems: Review of Systems  Constitutional: Negative.   HENT: Positive for hearing loss and tinnitus. Negative for ear discharge, ear pain, nosebleeds, sinus pain and sore throat.   Eyes: Negative.   Respiratory: Negative.  Negative for stridor.   Cardiovascular: Negative.   Gastrointestinal: Negative.   Genitourinary: Negative.   Musculoskeletal: Negative for back pain, falls, joint pain, myalgias and neck pain.  Skin: Negative for itching and rash.  Neurological: Negative.  Negative for dizziness and headaches.  Endo/Heme/Allergies: Negative.   Psychiatric/Behavioral: Negative.     Physical Exam: Estimated body mass index is 28.84 kg/m as calculated from  the following:   Height as of this encounter: 5\' 4"  (1.626 m).   Weight as of this encounter: 168 lb (76.2 kg). BP 110/64   Pulse 61   Temp (!) 97.1 F (36.2 C)   Ht 5\' 4"  (1.626 m)   Wt 168 lb (76.2 kg)   LMP 01/09/2019   SpO2 99%   BMI 28.84 kg/m  General Appearance: Well nourished, in no apparent distress.  Eyes: PERRLA, EOMs, conjunctiva no swelling or erythema, normal fundi and vessels.  Sinuses: No Frontal/maxillary tenderness  ENT/Mouth: Ext aud canals clear, normal light reflex with TMs without erythema, bulging. Good dentition. No erythema, swelling, or exudate on post pharynx. Tonsils not swollen or erythematous. Hearing normal.  Neck: Supple, thyroid normal. No bruits  Respiratory: Respiratory effort normal, BS equal bilaterally without rales, rhonchi, wheezing or stridor.  Cardio: RRR without murmurs, rubs or gallops. Brisk peripheral pulses without edema.  Chest: symmetric, with normal excursions and percussion.  Breasts: Symmetric, without lumps, nipple discharge, retractions.  Abdomen: Soft, nontender, no guarding, rebound, hernias, masses, or organomegaly.  Lymphatics: Non tender without lymphadenopathy.  Genitourinary: defer Musculoskeletal: Full ROM all peripheral extremities,5/5 strength, and normal gait.  Skin: erythematous scaly rash bilateral wrist. Warm,  dry without rashes, lesions, ecchymosis. Neuro: Cranial nerves intact, reflexes equal bilaterally. Normal muscle tone, no cerebellar symptoms. Sensation intact.  Psych: Awake and oriented X 3, normal affect, Insight and Judgment appropriate.   EKG: declines AORTA SCAN: declines  Vicie Mutters 2:08 PM [facility]

## 2019-01-28 ENCOUNTER — Encounter: Payer: Self-pay | Admitting: Physician Assistant

## 2019-01-28 ENCOUNTER — Ambulatory Visit: Payer: No Typology Code available for payment source | Admitting: Physician Assistant

## 2019-01-28 VITALS — BP 110/64 | HR 61 | Temp 97.1°F | Ht 64.0 in | Wt 168.0 lb

## 2019-01-28 DIAGNOSIS — E559 Vitamin D deficiency, unspecified: Secondary | ICD-10-CM

## 2019-01-28 DIAGNOSIS — Z0000 Encounter for general adult medical examination without abnormal findings: Secondary | ICD-10-CM

## 2019-01-28 DIAGNOSIS — N183 Chronic kidney disease, stage 3 unspecified: Secondary | ICD-10-CM

## 2019-01-28 DIAGNOSIS — I150 Renovascular hypertension: Secondary | ICD-10-CM

## 2019-01-28 DIAGNOSIS — Z85830 Personal history of malignant neoplasm of bone: Secondary | ICD-10-CM

## 2019-01-28 DIAGNOSIS — E6609 Other obesity due to excess calories: Secondary | ICD-10-CM

## 2019-01-28 DIAGNOSIS — Z6832 Body mass index (BMI) 32.0-32.9, adult: Secondary | ICD-10-CM

## 2019-01-28 DIAGNOSIS — Z0001 Encounter for general adult medical examination with abnormal findings: Secondary | ICD-10-CM

## 2019-01-28 DIAGNOSIS — Z79899 Other long term (current) drug therapy: Secondary | ICD-10-CM

## 2019-01-28 DIAGNOSIS — R7309 Other abnormal glucose: Secondary | ICD-10-CM

## 2019-01-28 DIAGNOSIS — E785 Hyperlipidemia, unspecified: Secondary | ICD-10-CM

## 2019-01-28 DIAGNOSIS — L309 Dermatitis, unspecified: Secondary | ICD-10-CM

## 2019-01-28 DIAGNOSIS — D649 Anemia, unspecified: Secondary | ICD-10-CM

## 2019-01-28 NOTE — Patient Instructions (Addendum)
Your LDL could improve, ideally we want it under a 100.  Your LDL is the bad cholesterol that can lead to heart attack and stroke. To lower your number you can decrease your fatty foods, red meat, cheese, milk and increase fiber like whole grains and veggies. You can also add a fiber supplement like Citracel or Benefiber, these do not cause gas and bloating and are safe to use. Especially if you have a strong family history of heart disease or stroke or you have evidence of plaque on any imaging like a chest xray, we may discuss at your next office visit putting you on a medication to get your number below 100.   HDL is healthy cholesterol and it is at goal, this tells me you eat veggies and/or are active. Goal for men is above 40, goal for women is above 50, this decreases heart attack and stroke risk.    B12 is low end of normal, add sublingual B12. The sublingual matters more than the dose, get any dose but make sure it melts in your mouth.   Will help with energy, memory/concentration, decrease nerve pain, and help with weight loss. B12 is water soluble vitamin so you can not over dose on it,.   Can try B complex if you want, will need to check next OV   Can do a steroid nasal spary 1-2 sparys at night each nostril.  Remember to spray each nostril twice towards the outer part of your eye.   Do not sniff but instead pinch your nose and tilt your head back to help the medicine get into your sinuses.   The best time to do this is at bedtime.  Stop if you get blurred vision or nose bleeds.   THIS WILL TAKE 7 DAYS TO WORK AND IS BETTER IF YOU START BEFORE SYMPTOMS SO IF YOU HAVE A SEASON OR TIME OF THE YEAR YOU ALWAYS GET A COLD, START BEFORE THAT!   COLD INFORMATION  Try just the allergy pill and prednisone for a few days, you always want to give your body 10 days to fight off infection with help before taking an anabiotic.   BEWARE ANTIBIOTICS Antibiotics have been linked with colon  infections, resistance and newest theory is colon cancer in 40-50 year olds. So it is VERY important to try to avoid them, antibiotics are NOT risk free medications.   If you are not feeling better make an office visit OR CONTACT us.   Here is more info below HOW TO TREAT VIRAL COUGH AND COLD SYMPTOMS:  -Symptoms usually last at least 1 week with the worst symptoms being around day 4.  - colds usually start with a sore throat and end with a cough, and the cough can take 2 weeks to get better.  -No antibiotics are needed for colds, flu, sore throats, cough, bronchitis UNLESS symptoms are longer than 7 days OR if you are getting better then get drastically worse.  -There are a lot of combination medications (Dayquil, Nyquil, Vicks 44, tyelnol cold and sinus, ETC). Please look at the ingredients on the back so that you are treating the correct symptoms and not doubling up on medications/ingredients.    Medicines you can use  Nasal congestion  Little Remedies saline spray (aerosol/mist)- can try this, it is in the kids section - pseudoephedrine (Sudafed)- behind the counter, do not use if you have high blood pressure, medicine that have -D in them.  - phenylephrine (Sudafed PE) -Dextormethorphan +  chlorpheniramine (Coridcidin HBP)- okay if you have high blood pressure -Oxymetazoline (Afrin) nasal spray- LIMIT to 3 days -Saline nasal spray -Neti pot (used distilled or bottled water)  Ear pain/congestion  -pseudoephedrine (sudafed) - Nasonex/flonase nasal spray  Fever  -Acetaminophen (Tyelnol) -Ibuprofen (Advil, motrin, aleve)  Sore Throat  -Acetaminophen (Tyelnol) -Ibuprofen (Advil, motrin, aleve) -Drink a lot of water -Gargle with salt water - Rest your voice (don't talk) -Throat sprays -Cough drops  Body Aches  -Acetaminophen (Tyelnol) -Ibuprofen (Advil, motrin, aleve)  Headache  -Acetaminophen (Tyelnol) -Ibuprofen (Advil, motrin, aleve) - Exedrin, Exedrin  Migraine  Allergy symptoms (cough, sneeze, runny nose, itchy eyes) -Claritin or loratadine cheapest but likely the weakest  -Zyrtec or certizine at night because it can make you sleepy -The strongest is allegra or fexafinadine  Cheapest at [employer], [employer], [employer]  Cough  -Dextromethorphan (Delsym)- medicine that has DM in it -Guafenesin (Mucinex/Robitussin) - cough drops - drink lots of water  Chest Congestion  -Guafenesin (Mucinex/Robitussin)  Red Itchy Eyes  - Naphcon-A  Upset Stomach  - Bland diet (nothing spicy, greasy, fried, and high acid foods like tomatoes, oranges, berries) -OKAY- cereal, bread, soup, crackers, rice -Eat smaller more frequent meals -reduce caffeine, no alcohol -Loperamide (Imodium-AD) if diarrhea -Prevacid for heart burn  General health when sick  -Hydration -wash your hands frequently -keep surfaces clean -change pillow cases and sheets often -Get fresh air but do not exercise strenuously -Vitamin D, double up on it - Vitamin C -Zinc

## 2019-01-29 LAB — COMPLETE METABOLIC PANEL WITH GFR
AG Ratio: 1.6 (calc) (ref 1.0–2.5)
ALKALINE PHOSPHATASE (APISO): 64 U/L (ref 31–125)
ALT: 14 U/L (ref 6–29)
AST: 14 U/L (ref 10–30)
Albumin: 4.2 g/dL (ref 3.6–5.1)
BUN/Creatinine Ratio: 18 (calc) (ref 6–22)
BUN: 35 mg/dL — ABNORMAL HIGH (ref 7–25)
CO2: 23 mmol/L (ref 20–32)
CREATININE: 2 mg/dL — AB (ref 0.50–1.10)
Calcium: 9.5 mg/dL (ref 8.6–10.2)
Chloride: 107 mmol/L (ref 98–110)
GFR, EST NON AFRICAN AMERICAN: 30 mL/min/{1.73_m2} — AB (ref >=60)
GFR, Est African American: 35 mL/min/{1.73_m2} — ABNORMAL LOW (ref >=60)
Globulin: 2.7 g/dL (calc) (ref 1.9–3.7)
Glucose, Bld: 69 mg/dL (ref 65–99)
Potassium: 3.9 mmol/L (ref 3.5–5.3)
Sodium: 140 mmol/L (ref 135–146)
Total Bilirubin: 0.4 mg/dL (ref 0.2–1.2)
Total Protein: 6.9 g/dL (ref 6.1–8.1)

## 2019-01-29 LAB — TSH: TSH: 2.15 mIU/L

## 2019-01-29 LAB — CBC WITH DIFFERENTIAL/PLATELET
ABSOLUTE MONOCYTES: 419 {cells}/uL (ref 200–950)
Basophils Absolute: 32 cells/uL (ref 0–200)
Basophils Relative: 0.4 %
EOS ABS: 87 {cells}/uL (ref 15–500)
Eosinophils Relative: 1.1 %
HCT: 36.7 % (ref 35.0–45.0)
HEMOGLOBIN: 12.5 g/dL (ref 11.7–15.5)
Lymphs Abs: 758 cells/uL — ABNORMAL LOW (ref 850–3900)
MCH: 28.8 pg (ref 27.0–33.0)
MCHC: 34.1 g/dL (ref 32.0–36.0)
MCV: 84.6 fL (ref 80.0–100.0)
MPV: 11.9 fL (ref 7.5–12.5)
Monocytes Relative: 5.3 %
Neutro Abs: 6604 cells/uL (ref 1500–7800)
Neutrophils Relative %: 83.6 %
PLATELETS: 215 10*3/uL (ref 140–400)
RBC: 4.34 10*6/uL (ref 3.80–5.10)
RDW: 12.2 % (ref 11.0–15.0)
TOTAL LYMPHOCYTE: 9.6 %
WBC: 7.9 10*3/uL (ref 3.8–10.8)

## 2019-01-29 LAB — URINALYSIS, ROUTINE W REFLEX MICROSCOPIC
BILIRUBIN URINE: NEGATIVE
Hgb urine dipstick: NEGATIVE
Ketones, ur: NEGATIVE
Leukocytes,Ua: NEGATIVE
Nitrite: NEGATIVE
Protein, ur: NEGATIVE
Specific Gravity, Urine: 1.014 (ref 1.001–1.03)
pH: 5.5 (ref 5.0–8.0)

## 2019-01-29 LAB — LIPID PANEL
CHOL/HDL RATIO: 4.1 (calc) (ref <5.0)
Cholesterol: 181 mg/dL (ref <200)
HDL: 44 mg/dL — ABNORMAL LOW (ref >=50)
LDL Cholesterol (Calc): 105 mg/dL (calc) — ABNORMAL HIGH
Non-HDL Cholesterol (Calc): 137 mg/dL (calc) — ABNORMAL HIGH (ref <130)
Triglycerides: 202 mg/dL — ABNORMAL HIGH (ref <150)

## 2019-01-29 LAB — MICROALBUMIN / CREATININE URINE RATIO
CREATININE, URINE: 86 mg/dL (ref 20–275)
Microalb Creat Ratio: 31 mcg/mg creat — ABNORMAL HIGH (ref <30)
Microalb, Ur: 2.7 mg/dL

## 2019-01-29 LAB — MAGNESIUM: Magnesium: 2 mg/dL (ref 1.5–2.5)

## 2019-03-05 ENCOUNTER — Other Ambulatory Visit: Payer: Self-pay | Admitting: Internal Medicine

## 2019-03-05 DIAGNOSIS — I150 Renovascular hypertension: Secondary | ICD-10-CM

## 2019-09-01 ENCOUNTER — Other Ambulatory Visit: Payer: Self-pay | Admitting: Physician Assistant

## 2019-09-01 DIAGNOSIS — I150 Renovascular hypertension: Secondary | ICD-10-CM

## 2019-10-01 ENCOUNTER — Other Ambulatory Visit: Payer: Self-pay | Admitting: Physician Assistant

## 2019-10-01 DIAGNOSIS — I150 Renovascular hypertension: Secondary | ICD-10-CM

## 2019-12-04 HISTORY — PX: PATELLA FRACTURE SURGERY: SHX735

## 2019-12-05 DIAGNOSIS — X58XXXA Exposure to other specified factors, initial encounter: Secondary | ICD-10-CM | POA: Diagnosis not present

## 2019-12-05 DIAGNOSIS — S82031A Displaced transverse fracture of right patella, initial encounter for closed fracture: Secondary | ICD-10-CM | POA: Diagnosis not present

## 2019-12-05 DIAGNOSIS — Z20828 Contact with and (suspected) exposure to other viral communicable diseases: Secondary | ICD-10-CM | POA: Diagnosis not present

## 2019-12-07 DIAGNOSIS — W19XXXA Unspecified fall, initial encounter: Secondary | ICD-10-CM | POA: Diagnosis not present

## 2019-12-07 DIAGNOSIS — Z96651 Presence of right artificial knee joint: Secondary | ICD-10-CM | POA: Diagnosis not present

## 2019-12-07 DIAGNOSIS — G8918 Other acute postprocedural pain: Secondary | ICD-10-CM | POA: Diagnosis not present

## 2019-12-07 DIAGNOSIS — Z85830 Personal history of malignant neoplasm of bone: Secondary | ICD-10-CM | POA: Diagnosis not present

## 2019-12-07 DIAGNOSIS — Z9221 Personal history of antineoplastic chemotherapy: Secondary | ICD-10-CM | POA: Diagnosis not present

## 2019-12-07 DIAGNOSIS — W1839XA Other fall on same level, initial encounter: Secondary | ICD-10-CM | POA: Diagnosis not present

## 2019-12-07 DIAGNOSIS — S82031A Displaced transverse fracture of right patella, initial encounter for closed fracture: Secondary | ICD-10-CM | POA: Diagnosis not present

## 2019-12-22 DIAGNOSIS — Z85830 Personal history of malignant neoplasm of bone: Secondary | ICD-10-CM | POA: Diagnosis not present

## 2019-12-22 DIAGNOSIS — W19XXXD Unspecified fall, subsequent encounter: Secondary | ICD-10-CM | POA: Diagnosis not present

## 2019-12-22 DIAGNOSIS — S82031D Displaced transverse fracture of right patella, subsequent encounter for closed fracture with routine healing: Secondary | ICD-10-CM | POA: Diagnosis not present

## 2019-12-22 DIAGNOSIS — S82091S Other fracture of right patella, sequela: Secondary | ICD-10-CM | POA: Diagnosis not present

## 2019-12-22 DIAGNOSIS — Z9889 Other specified postprocedural states: Secondary | ICD-10-CM | POA: Diagnosis not present

## 2019-12-23 ENCOUNTER — Encounter: Payer: Self-pay | Admitting: Internal Medicine

## 2019-12-30 ENCOUNTER — Other Ambulatory Visit: Payer: Self-pay | Admitting: Internal Medicine

## 2019-12-30 DIAGNOSIS — I150 Renovascular hypertension: Secondary | ICD-10-CM

## 2020-01-21 DIAGNOSIS — S82031A Displaced transverse fracture of right patella, initial encounter for closed fracture: Secondary | ICD-10-CM | POA: Diagnosis not present

## 2020-01-21 DIAGNOSIS — X58XXXA Exposure to other specified factors, initial encounter: Secondary | ICD-10-CM | POA: Diagnosis not present

## 2020-01-21 DIAGNOSIS — W1839XA Other fall on same level, initial encounter: Secondary | ICD-10-CM | POA: Diagnosis not present

## 2020-01-21 DIAGNOSIS — D480 Neoplasm of uncertain behavior of bone and articular cartilage: Secondary | ICD-10-CM | POA: Diagnosis not present

## 2020-01-21 DIAGNOSIS — Z96651 Presence of right artificial knee joint: Secondary | ICD-10-CM | POA: Diagnosis not present

## 2020-01-21 DIAGNOSIS — Y9259 Other trade areas as the place of occurrence of the external cause: Secondary | ICD-10-CM | POA: Diagnosis not present

## 2020-02-03 ENCOUNTER — Encounter: Payer: Self-pay | Admitting: Physician Assistant

## 2020-03-03 DIAGNOSIS — W1839XD Other fall on same level, subsequent encounter: Secondary | ICD-10-CM | POA: Diagnosis not present

## 2020-03-03 DIAGNOSIS — C4021 Malignant neoplasm of long bones of right lower limb: Secondary | ICD-10-CM | POA: Diagnosis not present

## 2020-03-03 DIAGNOSIS — X58XXXD Exposure to other specified factors, subsequent encounter: Secondary | ICD-10-CM | POA: Diagnosis not present

## 2020-03-03 DIAGNOSIS — S82001D Unspecified fracture of right patella, subsequent encounter for closed fracture with routine healing: Secondary | ICD-10-CM | POA: Diagnosis not present

## 2020-03-03 DIAGNOSIS — Z96651 Presence of right artificial knee joint: Secondary | ICD-10-CM | POA: Diagnosis not present

## 2020-03-07 ENCOUNTER — Encounter: Payer: No Typology Code available for payment source | Admitting: Physician Assistant

## 2020-03-08 ENCOUNTER — Encounter: Payer: Self-pay | Admitting: Internal Medicine

## 2020-03-14 NOTE — Progress Notes (Signed)
Complete Physical  Assessment and Plan:  Renovascular hypertension - continue medications, DASH diet, exercise and monitor at home. Call if greater than 130/80.  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  -     Lipid panel  Abnormal glucose Discussed general issues about diabetes pathophysiology and management., Educational material distributed., Suggested low cholesterol diet., Encouraged aerobic exercise., Discussed foot care., Reminded to get yearly retinal exam. -     Hemoglobin A1c  Medication management -     Magnesium  Vitamin D deficiency -     VITAMIN D 25 Hydroxy (Vit-D Deficiency, Fractures)  History of osteosarcoma monitor  Obesity with co morbidities - long discussion about weight loss, diet, and exercise   Discussed med's effects and SE's. Screening labs and tests as requested with regular follow-up as recommended. Over 40 minutes of exam, counseling, chart review, and complex, high level critical decision making was performed this visit.   HPI  44 y.o. female  presents for a complete physical and follow up for has Chronic kidney disease (CKD), stage III (moderate); History of osteosarcoma; Hypertension; Hyperlipidemia; Abnormal glucose; Medication management; Vitamin D deficiency; Obesity; and Eczema of both hands on their problem list..  Her blood pressure has been controlled at home, on enalapril 2.5, today their BP is BP: 120/68   Closed displaced transverse  fracture of right patella in Dec with surgery in Jan, still following up. No family history of osteopenia/osteoporosis. Non smoker.   She does workout, she has twins that, 26 in Oct year old boy and girl.  She denies chest pain, shortness of breath, dizziness.  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 With recent fracture will get DEXA.She is on BCP.    Feels that her hearing has gotten worse, had some tinnitus and decreased hearing since chemo, got tested and states not ready for hearing aids yet.   She is not on cholesterol medication and denies myalgias. Her cholesterol is at goal. The cholesterol last visit was:   Lab Results  Component Value Date   CHOL 181 01/28/2019   HDL 44 (L) 01/28/2019   LDLCALC 105 (H) 01/28/2019   TRIG 202 (H) 01/28/2019   CHOLHDL 4.1 01/28/2019   She is on celexa for OCD/Anxiety and doing well.   Last A1C in the office was:  Lab Results  Component Value Date   HGBA1C 4.8 01/24/2017   Last GFR: Lab Results  Component Value Date   GFRNONAA 30 (L) 01/28/2019   Patient is on Vitamin D supplement, 2000 IU daily started recently.  Lab Results  Component Value Date   VD25OH 38 01/28/2018     BMI is Body mass index is 30.37 kg/m., she is working on diet and exercise. She is walking more and doing step counter from work.  Wt Readings from Last 3 Encounters:  03/15/20 174 lb 3.2 oz (79 kg)  01/28/19 168  lb (76.2 kg)  01/28/18 180 lb 9.6 oz (81.9 kg)     Current Medications:  Current Outpatient Medications on File Prior to Visit  Medication Sig Dispense Refill  . citalopram (CELEXA) 40 MG tablet Take 1 tablet Daily for Mood 90 tablet 1  . enalapril (VASOTEC) 2.5 MG tablet Take 1 tablet Daily for BP & Kidney Protection 90 tablet 1  . norethindrone-ethinyl estradiol-iron (MICROGESTIN FE,GILDESS FE,LOESTRIN FE) 1.5-30 MG-MCG tablet Take 1 tablet by mouth daily.     No current  facility-administered medications on file prior to visit.   Allergies:  No Known Allergies   Medical History:  She has Chronic kidney disease (CKD), stage III (moderate); History of osteosarcoma; Hypertension; Hyperlipidemia; Abnormal glucose; Medication management; Vitamin D deficiency; Obesity; and Eczema of both hands on their problem list.   Health Maintenance:   Immunization History  Administered Date(s) Administered  . Influenza-Unspecified 10/06/2017  . PPD Test 11/24/2013, 11/29/2014, 12/12/2015  . Pneumococcal Polysaccharide-23 11/24/2013  . Tdap 07/11/2012   Tetanus: 2013  Pneumovax: 2014 Prevnar 13: N/A Flu vaccine: 2020 Zostavax N/A COVID vaccine complete  No LMP recorded. Pap: 2020 Dr. Corinna Capra MGM: 07/2019 at Dr. Corinna Capra DEXA: N/A Colonoscopy: N/A EGD: N/A  Last Dental Exam: last month she went Last Eye Exam: None, no changes in vision Patient Care Team: Unk Pinto, MD as PCP - General (Internal Medicine)  Surgical History:  She has a past surgical history that includes Right leg replacement; LEEP; No past surgeries; Tumor removal (2011); and Cesarean section (09/08/2012). Family History:  Herfamily history includes Diabetes in her father; Hypertension in her father. Social History:  She reports that she quit smoking about 23 years ago. She has never used smokeless tobacco. She reports current alcohol use of about 2.0 standard drinks of alcohol per week. She reports that she does not use drugs.  Review of Systems: Review of Systems  Constitutional: Negative.   HENT: Positive for hearing loss and tinnitus. Negative for ear discharge, ear pain, nosebleeds, sinus pain and sore throat.   Eyes: Negative.   Respiratory: Negative.  Negative for stridor.   Cardiovascular: Negative.   Gastrointestinal: Negative.   Genitourinary: Negative.   Musculoskeletal: Negative for back pain, falls, joint pain, myalgias and neck pain.  Skin: Negative for itching and rash.   Neurological: Negative.  Negative for dizziness and headaches.  Endo/Heme/Allergies: Negative.   Psychiatric/Behavioral: Negative.     Physical Exam: Estimated body mass index is 30.37 kg/m as calculated from the following:   Height as of this encounter: 5' 3.5" (1.613 m).   Weight as of this encounter: 174 lb 3.2 oz (79 kg). BP 120/68   Pulse 82   Temp 97.7 F (36.5 C)   Ht 5' 3.5" (1.613 m)   Wt 174 lb 3.2 oz (79 kg)   SpO2 100%   BMI 30.37 kg/m  General Appearance: Well nourished, in no apparent distress.  Eyes: PERRLA, EOMs, conjunctiva no swelling or erythema, normal fundi and vessels.  Sinuses: No Frontal/maxillary tenderness  ENT/Mouth: Ext aud canals clear, normal light reflex with TMs without erythema, bulging. Good dentition. No erythema, swelling, or exudate on post pharynx. Tonsils not swollen or erythematous. Hearing normal.  Neck: Supple, thyroid normal. No bruits  Respiratory: Respiratory effort normal, BS equal bilaterally without rales, rhonchi, wheezing or stridor.  Cardio: RRR without murmurs, rubs or gallops. Brisk peripheral pulses without edema.  Chest: symmetric, with normal excursions and percussion.  Breasts: Symmetric, without lumps, nipple discharge, retractions.  Abdomen: Soft, nontender, no guarding, rebound, hernias, masses, or organomegaly.  Lymphatics: Non tender without lymphadenopathy.  Genitourinary: defer Musculoskeletal: Full ROM all peripheral extremities,5/5 strength, and normal gait.  Skin: erythematous scaly rash bilateral wrist. Warm, dry without rashes, lesions, ecchymosis. Neuro: Cranial nerves intact, reflexes equal bilaterally. Normal muscle tone, no cerebellar symptoms. Sensation intact.  Psych: Awake and oriented X 3, normal affect, Insight and Judgment appropriate.   EKG: declines AORTA SCAN: declines  Vicie Mutters 11:15 AM [facility]

## 2020-03-15 ENCOUNTER — Encounter: Payer: Self-pay | Admitting: Physician Assistant

## 2020-03-15 ENCOUNTER — Other Ambulatory Visit: Payer: Self-pay

## 2020-03-15 ENCOUNTER — Ambulatory Visit (INDEPENDENT_AMBULATORY_CARE_PROVIDER_SITE_OTHER): Payer: BC Managed Care – PPO | Admitting: Physician Assistant

## 2020-03-15 VITALS — BP 120/68 | HR 82 | Temp 97.7°F | Ht 63.5 in | Wt 174.2 lb

## 2020-03-15 DIAGNOSIS — I150 Renovascular hypertension: Secondary | ICD-10-CM

## 2020-03-15 DIAGNOSIS — R7309 Other abnormal glucose: Secondary | ICD-10-CM

## 2020-03-15 DIAGNOSIS — Z1389 Encounter for screening for other disorder: Secondary | ICD-10-CM | POA: Diagnosis not present

## 2020-03-15 DIAGNOSIS — Z8781 Personal history of (healed) traumatic fracture: Secondary | ICD-10-CM

## 2020-03-15 DIAGNOSIS — D649 Anemia, unspecified: Secondary | ICD-10-CM

## 2020-03-15 DIAGNOSIS — N183 Chronic kidney disease, stage 3 unspecified: Secondary | ICD-10-CM

## 2020-03-15 DIAGNOSIS — E559 Vitamin D deficiency, unspecified: Secondary | ICD-10-CM | POA: Diagnosis not present

## 2020-03-15 DIAGNOSIS — Z1329 Encounter for screening for other suspected endocrine disorder: Secondary | ICD-10-CM

## 2020-03-15 DIAGNOSIS — E6609 Other obesity due to excess calories: Secondary | ICD-10-CM

## 2020-03-15 DIAGNOSIS — Z0000 Encounter for general adult medical examination without abnormal findings: Secondary | ICD-10-CM | POA: Diagnosis not present

## 2020-03-15 DIAGNOSIS — Z79899 Other long term (current) drug therapy: Secondary | ICD-10-CM | POA: Diagnosis not present

## 2020-03-15 DIAGNOSIS — Z13220 Encounter for screening for lipoid disorders: Secondary | ICD-10-CM | POA: Diagnosis not present

## 2020-03-15 DIAGNOSIS — Z0001 Encounter for general adult medical examination with abnormal findings: Secondary | ICD-10-CM

## 2020-03-15 DIAGNOSIS — L309 Dermatitis, unspecified: Secondary | ICD-10-CM

## 2020-03-15 DIAGNOSIS — Z6832 Body mass index (BMI) 32.0-32.9, adult: Secondary | ICD-10-CM

## 2020-03-15 DIAGNOSIS — E785 Hyperlipidemia, unspecified: Secondary | ICD-10-CM

## 2020-03-15 DIAGNOSIS — Z85830 Personal history of malignant neoplasm of bone: Secondary | ICD-10-CM

## 2020-03-15 NOTE — Patient Instructions (Signed)
General eating tips  What to Avoid . Avoid added sugars o Often added sugar can be found in processed foods such as many condiments, dry cereals, cakes, cookies, chips, crisps, crackers, candies, sweetened drinks, etc.  o Read labels and AVOID/DECREASE use of foods with the following in their ingredient list: Sugar, fructose, high fructose corn syrup, sucrose, glucose, maltose, dextrose, molasses, cane sugar, brown sugar, any type of syrup, agave nectar, etc.   . Avoid snacking in between meals- drink water or if you feel you need a snack, pick a high water content snack such as cucumbers, watermelon, or any veggie.  Marland Kitchen Avoid foods made with flour o If you are going to eat food made with flour, choose those made with whole-grains; and, minimize your consumption as much as is tolerable . Avoid processed foods o These foods are generally stocked in the middle of the grocery store.  o Focus on shopping on the perimeter of the grocery.  What to Include . Vegetables o GREEN LEAFY VEGETABLES: Kale, spinach, mustard greens, collard greens, cabbage, broccoli, etc. o OTHER: Asparagus, cauliflower, eggplant, carrots, peas, Brussel sprouts, tomatoes, bell peppers, zucchini, beets, cucumbers, etc. . Grains, seeds, and legumes o Beans: kidney beans, black eyed peas, garbanzo beans, black beans, pinto beans, etc. o Whole, unrefined grains: brown rice, barley, bulgur, oatmeal, etc. . Healthy fats  o Avoid highly processed fats such as vegetable oil o Examples of healthy fats: avocado, olives, virgin olive oil, dark chocolate (?72% Cocoa), nuts (peanuts, almonds, walnuts, cashews, pecans, etc.) o Please still do small amount of these healthy fats, they are dense in calories.  . Low - Moderate Intake of Animal Sources of Protein o Meat sources: chicken, [place], salmon, tuna. Limit to 4 ounces of meat at one time or the size of your palm. o Consider limiting dairy sources, but when choosing dairy focus on:  PLAIN [place] yogurt, cottage cheese, high-protein milk . Fruit o Choose berries

## 2020-03-16 LAB — URINALYSIS, ROUTINE W REFLEX MICROSCOPIC
Bacteria, UA: NONE SEEN /HPF
Bilirubin Urine: NEGATIVE
Glucose, UA: NEGATIVE
Hgb urine dipstick: NEGATIVE
Hyaline Cast: NONE SEEN /LPF
Ketones, ur: NEGATIVE
Leukocytes,Ua: NEGATIVE
Nitrite: NEGATIVE
RBC / HPF: NONE SEEN /HPF (ref 0–2)
Specific Gravity, Urine: 1.014 (ref 1.001–1.03)
pH: 6.5 (ref 5.0–8.0)

## 2020-03-16 LAB — CBC WITH DIFFERENTIAL/PLATELET
Absolute Monocytes: 474 cells/uL (ref 200–950)
Basophils Absolute: 10 cells/uL (ref 0–200)
Basophils Relative: 0.2 %
Eosinophils Absolute: 143 cells/uL (ref 15–500)
Eosinophils Relative: 2.8 %
HCT: 39.6 % (ref 35.0–45.0)
Hemoglobin: 13 g/dL (ref 11.7–15.5)
Lymphs Abs: 1270 cells/uL (ref 850–3900)
MCH: 28 pg (ref 27.0–33.0)
MCHC: 32.8 g/dL (ref 32.0–36.0)
MCV: 85.3 fL (ref 80.0–100.0)
MPV: 11.1 fL (ref 7.5–12.5)
Monocytes Relative: 9.3 %
Neutro Abs: 3203 cells/uL (ref 1500–7800)
Neutrophils Relative %: 62.8 %
Platelets: 214 10*3/uL (ref 140–400)
RBC: 4.64 10*6/uL (ref 3.80–5.10)
RDW: 12.4 % (ref 11.0–15.0)
Total Lymphocyte: 24.9 %
WBC: 5.1 10*3/uL (ref 3.8–10.8)

## 2020-03-16 LAB — LIPID PANEL
Cholesterol: 183 mg/dL (ref <200)
HDL: 53 mg/dL (ref >=50)
LDL Cholesterol (Calc): 105 mg/dL (calc) — ABNORMAL HIGH
Non-HDL Cholesterol (Calc): 130 mg/dL (calc) — ABNORMAL HIGH (ref <130)
Total CHOL/HDL Ratio: 3.5 (calc) (ref <5.0)
Triglycerides: 134 mg/dL (ref <150)

## 2020-03-16 LAB — COMPLETE METABOLIC PANEL WITH GFR
AG Ratio: 1.4 (calc) (ref 1.0–2.5)
ALT: 17 U/L (ref 6–29)
AST: 18 U/L (ref 10–30)
Albumin: 4.2 g/dL (ref 3.6–5.1)
Alkaline phosphatase (APISO): 68 U/L (ref 31–125)
BUN/Creatinine Ratio: 20 (calc) (ref 6–22)
BUN: 36 mg/dL — ABNORMAL HIGH (ref 7–25)
CO2: 25 mmol/L (ref 20–32)
Calcium: 9.4 mg/dL (ref 8.6–10.2)
Chloride: 105 mmol/L (ref 98–110)
Creat: 1.78 mg/dL — ABNORMAL HIGH (ref 0.50–1.10)
GFR, Est African American: 40 mL/min/{1.73_m2} — ABNORMAL LOW (ref >=60)
GFR, Est Non African American: 34 mL/min/{1.73_m2} — ABNORMAL LOW (ref >=60)
Globulin: 2.9 g/dL (calc) (ref 1.9–3.7)
Glucose, Bld: 78 mg/dL (ref 65–99)
Potassium: 4.5 mmol/L (ref 3.5–5.3)
Sodium: 137 mmol/L (ref 135–146)
Total Bilirubin: 0.4 mg/dL (ref 0.2–1.2)
Total Protein: 7.1 g/dL (ref 6.1–8.1)

## 2020-03-16 LAB — MAGNESIUM: Magnesium: 2 mg/dL (ref 1.5–2.5)

## 2020-03-16 LAB — TSH: TSH: 2.52 mIU/L

## 2020-03-16 LAB — VITAMIN D 25 HYDROXY (VIT D DEFICIENCY, FRACTURES): Vit D, 25-Hydroxy: 38 ng/mL (ref 30–100)

## 2020-03-16 LAB — MICROALBUMIN / CREATININE URINE RATIO
Creatinine, Urine: 79 mg/dL (ref 20–275)
Microalb Creat Ratio: 65 mcg/mg creat — ABNORMAL HIGH (ref <30)
Microalb, Ur: 5.1 mg/dL

## 2020-04-19 ENCOUNTER — Encounter: Payer: Self-pay | Admitting: Internal Medicine

## 2020-05-22 DIAGNOSIS — I150 Renovascular hypertension: Secondary | ICD-10-CM

## 2020-05-22 MED ORDER — ENALAPRIL MALEATE 5 MG PO TABS: ORAL_TABLET | ORAL | 1 refills | Status: DC

## 2020-06-02 DIAGNOSIS — W1839XD Other fall on same level, subsequent encounter: Secondary | ICD-10-CM | POA: Diagnosis not present

## 2020-06-02 DIAGNOSIS — C499 Malignant neoplasm of connective and soft tissue, unspecified: Secondary | ICD-10-CM | POA: Diagnosis not present

## 2020-06-02 DIAGNOSIS — C4021 Malignant neoplasm of long bones of right lower limb: Secondary | ICD-10-CM | POA: Diagnosis not present

## 2020-06-02 DIAGNOSIS — S82031D Displaced transverse fracture of right patella, subsequent encounter for closed fracture with routine healing: Secondary | ICD-10-CM | POA: Diagnosis not present

## 2020-06-02 DIAGNOSIS — X58XXXD Exposure to other specified factors, subsequent encounter: Secondary | ICD-10-CM | POA: Diagnosis not present

## 2020-06-02 DIAGNOSIS — Z96651 Presence of right artificial knee joint: Secondary | ICD-10-CM | POA: Diagnosis not present

## 2020-08-10 ENCOUNTER — Telehealth: Payer: Self-pay | Admitting: Physician Assistant

## 2020-08-10 DIAGNOSIS — N183 Chronic kidney disease, stage 3 unspecified: Secondary | ICD-10-CM

## 2020-08-10 DIAGNOSIS — I150 Renovascular hypertension: Secondary | ICD-10-CM

## 2020-08-10 DIAGNOSIS — R809 Proteinuria, unspecified: Secondary | ICD-10-CM

## 2020-08-10 DIAGNOSIS — E785 Hyperlipidemia, unspecified: Secondary | ICD-10-CM

## 2020-08-10 NOTE — Telephone Encounter (Signed)
patient requesting lab only to recheck April labs, not follow up office visit. Please advise your recommendation.

## 2020-08-10 NOTE — Telephone Encounter (Signed)
Ill put in lab only, if any of the labs are abnormal will have patient follow up in the office.

## 2020-08-10 NOTE — Addendum Note (Signed)
Addended by: Vicie Mutters R on: 08/10/2020 04:00 PM   Modules accepted: Orders

## 2020-08-14 DIAGNOSIS — H9313 Tinnitus, bilateral: Secondary | ICD-10-CM

## 2020-08-14 DIAGNOSIS — H9193 Unspecified hearing loss, bilateral: Secondary | ICD-10-CM

## 2020-08-16 ENCOUNTER — Other Ambulatory Visit: Payer: BC Managed Care – PPO

## 2020-08-16 ENCOUNTER — Other Ambulatory Visit: Payer: Self-pay

## 2020-08-16 DIAGNOSIS — N183 Chronic kidney disease, stage 3 unspecified: Secondary | ICD-10-CM

## 2020-08-16 DIAGNOSIS — Z79899 Other long term (current) drug therapy: Secondary | ICD-10-CM

## 2020-08-17 LAB — MICROALBUMIN / CREATININE URINE RATIO
Creatinine, Urine: 63 mg/dL (ref 20–275)
Microalb Creat Ratio: 29 mcg/mg creat (ref <30)
Microalb, Ur: 1.8 mg/dL

## 2020-08-17 LAB — COMPREHENSIVE METABOLIC PANEL
AG Ratio: 1.4 (calc) (ref 1.0–2.5)
ALT: 15 U/L (ref 6–29)
AST: 15 U/L (ref 10–30)
Albumin: 4 g/dL (ref 3.6–5.1)
Alkaline phosphatase (APISO): 58 U/L (ref 31–125)
BUN/Creatinine Ratio: 16 (calc) (ref 6–22)
BUN: 31 mg/dL — ABNORMAL HIGH (ref 7–25)
CO2: 21 mmol/L (ref 20–32)
Calcium: 9.2 mg/dL (ref 8.6–10.2)
Chloride: 105 mmol/L (ref 98–110)
Creat: 1.98 mg/dL — ABNORMAL HIGH (ref 0.50–1.10)
Globulin: 2.8 g/dL (calc) (ref 1.9–3.7)
Glucose, Bld: 78 mg/dL (ref 65–99)
Potassium: 5 mmol/L (ref 3.5–5.3)
Sodium: 136 mmol/L (ref 135–146)
Total Bilirubin: 0.5 mg/dL (ref 0.2–1.2)
Total Protein: 6.8 g/dL (ref 6.1–8.1)

## 2020-08-24 ENCOUNTER — Other Ambulatory Visit: Payer: Self-pay | Admitting: Internal Medicine

## 2020-09-21 ENCOUNTER — Other Ambulatory Visit: Payer: Self-pay | Admitting: Internal Medicine

## 2020-09-29 DIAGNOSIS — Z85830 Personal history of malignant neoplasm of bone: Secondary | ICD-10-CM | POA: Diagnosis not present

## 2020-09-29 DIAGNOSIS — H9313 Tinnitus, bilateral: Secondary | ICD-10-CM | POA: Diagnosis not present

## 2020-09-29 DIAGNOSIS — J343 Hypertrophy of nasal turbinates: Secondary | ICD-10-CM | POA: Diagnosis not present

## 2020-09-29 DIAGNOSIS — H903 Sensorineural hearing loss, bilateral: Secondary | ICD-10-CM | POA: Diagnosis not present

## 2020-11-16 ENCOUNTER — Other Ambulatory Visit: Payer: Self-pay

## 2020-11-16 ENCOUNTER — Ambulatory Visit
Admission: RE | Admit: 2020-11-16 | Discharge: 2020-11-16 | Disposition: A | Discharge status: Home / Self Care | Payer: BC Managed Care – PPO | Source: Ambulatory Visit | Attending: Physician Assistant | Admitting: Physician Assistant

## 2020-11-16 DIAGNOSIS — Z8781 Personal history of (healed) traumatic fracture: Secondary | ICD-10-CM

## 2020-11-16 DIAGNOSIS — M85852 Other specified disorders of bone density and structure, left thigh: Secondary | ICD-10-CM | POA: Diagnosis not present

## 2020-11-16 DIAGNOSIS — Z780 Asymptomatic menopausal state: Secondary | ICD-10-CM | POA: Diagnosis not present

## 2020-11-24 ENCOUNTER — Other Ambulatory Visit: Payer: Self-pay | Admitting: *Deleted

## 2020-11-24 MED ORDER — CITALOPRAM HYDROBROMIDE 40 MG PO TABS: ORAL_TABLET | ORAL | 1 refills | Status: DC

## 2020-11-30 ENCOUNTER — Other Ambulatory Visit: Payer: Self-pay | Admitting: *Deleted

## 2020-11-30 DIAGNOSIS — I150 Renovascular hypertension: Secondary | ICD-10-CM

## 2020-11-30 MED ORDER — ENALAPRIL MALEATE 5 MG PO TABS: ORAL_TABLET | ORAL | 1 refills | Status: DC

## 2020-12-06 ENCOUNTER — Other Ambulatory Visit: Payer: Self-pay | Admitting: Internal Medicine

## 2020-12-06 DIAGNOSIS — I150 Renovascular hypertension: Secondary | ICD-10-CM

## 2020-12-06 MED ORDER — ENALAPRIL MALEATE 5 MG PO TABS: ORAL_TABLET | ORAL | 0 refills | Status: DC

## 2020-12-09 ENCOUNTER — Other Ambulatory Visit: Payer: Self-pay | Admitting: Physician Assistant

## 2021-02-02 ENCOUNTER — Encounter: Payer: No Typology Code available for payment source | Admitting: Physician Assistant

## 2021-02-07 DIAGNOSIS — Z01419 Encounter for gynecological examination (general) (routine) without abnormal findings: Secondary | ICD-10-CM | POA: Diagnosis not present

## 2021-02-07 DIAGNOSIS — Z6830 Body mass index (BMI) 30.0-30.9, adult: Secondary | ICD-10-CM | POA: Diagnosis not present

## 2021-02-07 DIAGNOSIS — Z1231 Encounter for screening mammogram for malignant neoplasm of breast: Secondary | ICD-10-CM | POA: Diagnosis not present

## 2021-03-14 DIAGNOSIS — H903 Sensorineural hearing loss, bilateral: Secondary | ICD-10-CM | POA: Insufficient documentation

## 2021-03-14 NOTE — Progress Notes (Signed)
Complete Physical  Assessment and Plan:  Renovascular hypertension - continue medications, DASH diet, exercise and monitor at home. Call if greater than 130/80.  -     CBC with Differential/Platelet -     CMP/GFR -     TSH -     Urinalysis, Routine w reflex microscopic -     Microalbumin / creatinine urine ratio  Chronic kidney disease (CKD), stage IIIb (moderate) (HCC) Continue ACEi; stable for many years, no longer seeing nephrology Increase fluids, avoid NSAIDS, monitor sugars, will monitor -     CMP/GFR -     UA, Micralbumin -     Check PTH  Hyperlipidemia, unspecified hyperlipidemia type -mild elevations managed by lifestyle, check lipids, decrease fatty foods, increase activity.  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 CBC with Differential/Platelet  . COMPLETE METABOLIC PANEL WITH GFR  . Magnesium  . Lipid panel  . TSH  . VITAMIN D 25 Hydroxy (Vit-D Deficiency, Fractures)  . Microalbumin / creatinine urine ratio  . Urinalysis, Routine w reflex microscopic  . Parathyroid hormone, intact (no Ca)     Discussed med's effects and SE's. Screening labs and tests as requested with regular follow-up as recommended. Over 40 minutes of exam, counseling, chart review, and complex, high level critical decision making was performed this visit.   Future Appointments  Date Time Provider [place]  03/15/2022 10:00 AM Liane Comber, NP [facility] None     HPI  45 y.o. female   presents for a complete physical and follow up for has Chronic kidney disease (CKD), stage III (moderate) ([place]); History of osteosarcoma; Hypertension; Hyperlipidemia; Abnormal glucose; Medication management; Vitamin D deficiency; Obesity (BMI 30.0-34.9); Eczema of both hands; and Bilateral sensorineural hearing loss on their problem list..  She is married, she has twins that, 8 boy and girl, doing well. 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   She follows with Dr. Corinna Capra at [facility], goes annually.   No concerns today.   She had history of osteosarcoma s/p chem in 2000, follows with [facility] onc q2y. Chemotherapy resulted in CKD IIIb, proteinuria resolved on ACEi, BP controlled. Has not had PTH checked yet.   She is on celexa for OCD/Anxiety and doing well. Didn't do well tapering.   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 Unice Bailey and recommended hearing aids, got in 12/2020 and doing well.   BMI is Body mass index is 30.03 kg/m., she has been working on diet and exercise. She works out regularly. She is avoid soda and fried foods. Does lots of chicken, red meat 2-3 times. Reports 1-2 vggies per day, minimal fruit.  1 cup of coffee, 5-6 glasses of water.  Wt Readings from Last 3 Encounters:  03/15/21 172 lb 3.2 oz (78.1 kg)  03/15/20 174 lb 3.2 oz (79 kg)  01/28/19 168 lb (76.2 kg)   Her blood pressure has been controlled at home, on  enalapril 5 mg daily, today their BP is BP: 104/72  She does workout,   She denies chest pain, shortness of breath, dizziness.  She is not on cholesterol medication and denies myalgias. Her cholesterol is at goal. The cholesterol last visit was:   Lab Results  Component Value Date   CHOL 183 03/15/2020   HDL 53 03/15/2020   LDLCALC 105 (H) 03/15/2020   TRIG 134 03/15/2020   CHOLHDL 3.5 03/15/2020    Last A1C in the office was:  Lab Results  Component Value Date   HGBA1C 4.8 01/24/2017   Last  GFR: Lab Results  Component Value Date   GFRNONAA 34 (L) 03/15/2020   GFRNONAA 30 (L) 01/28/2019   GFRNONAA 31 (L) 01/28/2018   Patient is on Vitamin D supplement, 4000 IU daily started recently.  Lab Results  Component Value Date   VD25OH 38 03/15/2020       Current Medications:  Current Outpatient Medications on File Prior to Visit  Medication Sig Dispense Refill  . amoxicillin (AMOXIL) 500 MG capsule Take      2 capsules   1 to 2 hours        prior to Dental Procedure 30 capsule 0  . citalopram (CELEXA) 40 MG tablet Take     1 tablet     Daily    for Mood 90 tablet 1  . enalapril (VASOTEC) 5 MG tablet Take      1 tablet      Daily       for BP & Kidney Protection 90 tablet 0  . norethindrone-ethinyl estradiol-iron (MICROGESTIN FE,GILDESS FE,LOESTRIN FE) 1.5-30 MG-MCG tablet Take 1 tablet by mouth daily.    . Vitamin D, Cholecalciferol, 10 MCG (400 UNIT) TABS Take by mouth daily.     No current facility-administered medications on file prior to visit.   Allergies:  No Known Allergies   Medical History:  She has Chronic kidney disease (CKD), stage III (moderate) ([place]); History of osteosarcoma; Hypertension; Hyperlipidemia; Abnormal glucose; Medication management; Vitamin D deficiency; Obesity (BMI 30.0-34.9); Eczema of both hands; and Bilateral sensorineural hearing loss on their problem list.   Health Maintenance:   Immunization History  Administered Date(s) Administered  . Influenza, Quadrivalent, Recombinant, Inj, Pf 09/27/2019  . Influenza-Unspecified 10/06/2017  . PFIZER(Purple Top)SARS-COV-2 Vaccination 02/16/2020, 03/08/2020  . PPD Test 11/24/2013, 11/29/2014, 12/12/2015  . Pneumococcal Polysaccharide-23 11/24/2013  . Tdap 07/11/2012   Tetanus: 2013  Pneumovax: 2014 Flu vaccine: 2020 Shingles: n/a COVID 19: 3/3, pfizer  No LMP recorded. Pap: 02/07/2021 Dr. Corinna Capra MGM: 02/07/2021 at Dr. Corinna Capra DEXA: 11/2020, normal   Colonoscopy: due 2023 EGD: N/A  Last Dental  Exam: goes q19m, next 4/202 Last Eye Exam: 2021, no changes in vision  Patient Care Team: Unk Pinto, MD as PCP - General (Internal Medicine) Melida Quitter, MD as Consulting Physician (Otolaryngology)  Surgical History:  She has a past surgical history that includes Right leg replacement; LEEP; Tumor removal (Right, 2011); Cesarean section (09/08/2012); Patella fracture surgery (Right, 12/2019); and Wisdom tooth extraction. Family History:  Herfamily history includes Colon cancer in her paternal great-grandmother; Diabetes in her father; Hyperlipidemia in her maternal grandfather; Hypertension in her father and maternal grandfather; Lung cancer in her paternal grandmother. Social History:  She reports that she quit smoking about 24 years ago. Her smoking use included cigarettes. She has a 3.00 pack-year smoking history. She has never used smokeless tobacco. She reports current alcohol use of about 2.0 standard drinks of alcohol  per week. She reports previous drug use. Drug: Marijuana.   Review of Systems: Review of Systems  Constitutional: Negative.  Negative for malaise/fatigue and weight loss.  HENT: Positive for hearing loss and tinnitus. Negative for ear discharge, ear pain, nosebleeds, sinus pain and sore throat.   Eyes: Negative.  Negative for blurred vision and double vision.  Respiratory: Negative.  Negative for cough, shortness of breath, wheezing and stridor.   Cardiovascular: Negative.  Negative for chest pain, palpitations, orthopnea, claudication and leg swelling.  Gastrointestinal: Negative.  Negative for abdominal pain, blood in stool, constipation, diarrhea, heartburn, melena, nausea and vomiting.  Genitourinary: Negative.   Musculoskeletal: Negative for back pain, falls, joint pain, myalgias and neck pain.  Skin: Negative for itching and rash.  Neurological: Negative.  Negative for dizziness, tingling, sensory change, weakness and headaches.  Endo/Heme/Allergies:  Negative.  Negative for polydipsia.  Psychiatric/Behavioral: Negative.   All other systems reviewed and are negative.   Physical Exam: Estimated body mass index is 30.03 kg/m as calculated from the following:   Height as of this encounter: 5' 3.5" (1.613 m).   Weight as of this encounter: 172 lb 3.2 oz (78.1 kg). BP 104/72   Pulse 92   Temp (!) 97.5 F (36.4 C)   Ht 5' 3.5" (1.613 m)   Wt 172 lb 3.2 oz (78.1 kg)   SpO2 98%   BMI 30.03 kg/m  General Appearance: Well nourished, in no apparent distress.  Eyes: PERRLA, EOMs, conjunctiva no swelling or erythema Sinuses: No Frontal/maxillary tenderness  ENT/Mouth: Ext aud canals clear, normal light reflex with TMs without erythema, bulging. Good dentition. No erythema, swelling, or exudate on post pharynx. Tonsils not swollen or erythematous. HOH with bil hearing aids.  Neck: Supple, thyroid normal. No bruits  Respiratory: Respiratory effort normal, BS equal bilaterally without rales, rhonchi, wheezing or stridor.  Cardio: RRR without murmurs, rubs or gallops. Brisk peripheral pulses without edema.  Chest: symmetric, with normal excursions and percussion.  Breasts: defer to GYN Abdomen: Soft, nontender, no guarding, rebound, hernias, masses, or organomegaly.  Lymphatics: Non tender without lymphadenopathy.  Genitourinary: defer to GYN Musculoskeletal: Full ROM all peripheral extremities,5/5 strength, and normal gait.  Skin: Warm, dry without rashes, lesions, ecchymosis. Neuro: Cranial nerves intact, reflexes equal bilaterally. Normal muscle tone, no cerebellar symptoms. Sensation intact.  Psych: Awake and oriented X 3, normal affect, Insight and Judgment appropriate.   EKG: reports had normal 2021, declines today   Izora Ribas 10:27 AM [facility]

## 2021-03-15 ENCOUNTER — Ambulatory Visit: Payer: BC Managed Care – PPO | Admitting: Adult Health

## 2021-03-15 ENCOUNTER — Other Ambulatory Visit: Payer: Self-pay

## 2021-03-15 ENCOUNTER — Encounter: Payer: Self-pay | Admitting: Adult Health

## 2021-03-15 VITALS — BP 104/72 | HR 92 | Temp 97.5°F | Ht 63.5 in | Wt 172.2 lb

## 2021-03-15 DIAGNOSIS — I150 Renovascular hypertension: Secondary | ICD-10-CM | POA: Diagnosis not present

## 2021-03-15 DIAGNOSIS — E66811 Obesity, class 1: Secondary | ICD-10-CM

## 2021-03-15 DIAGNOSIS — L309 Dermatitis, unspecified: Secondary | ICD-10-CM

## 2021-03-15 DIAGNOSIS — E559 Vitamin D deficiency, unspecified: Secondary | ICD-10-CM

## 2021-03-15 DIAGNOSIS — Z1389 Encounter for screening for other disorder: Secondary | ICD-10-CM

## 2021-03-15 DIAGNOSIS — Z0000 Encounter for general adult medical examination without abnormal findings: Secondary | ICD-10-CM

## 2021-03-15 DIAGNOSIS — E785 Hyperlipidemia, unspecified: Secondary | ICD-10-CM | POA: Diagnosis not present

## 2021-03-15 DIAGNOSIS — Z1329 Encounter for screening for other suspected endocrine disorder: Secondary | ICD-10-CM

## 2021-03-15 DIAGNOSIS — Z13220 Encounter for screening for lipoid disorders: Secondary | ICD-10-CM | POA: Diagnosis not present

## 2021-03-15 DIAGNOSIS — H903 Sensorineural hearing loss, bilateral: Secondary | ICD-10-CM

## 2021-03-15 DIAGNOSIS — Z79899 Other long term (current) drug therapy: Secondary | ICD-10-CM | POA: Diagnosis not present

## 2021-03-15 DIAGNOSIS — N1832 Chronic kidney disease, stage 3b: Secondary | ICD-10-CM

## 2021-03-15 DIAGNOSIS — R7309 Other abnormal glucose: Secondary | ICD-10-CM | POA: Diagnosis not present

## 2021-03-15 DIAGNOSIS — Z85830 Personal history of malignant neoplasm of bone: Secondary | ICD-10-CM

## 2021-03-15 DIAGNOSIS — Z131 Encounter for screening for diabetes mellitus: Secondary | ICD-10-CM

## 2021-03-15 DIAGNOSIS — E669 Obesity, unspecified: Secondary | ICD-10-CM

## 2021-03-15 NOTE — Patient Instructions (Signed)
Jackie Wilson , Thank you for taking time to come for your Annual Wellness Visit. I appreciate your ongoing commitment to your health goals. Please review the following plan we discussed and let me know if I can assist you in the future.   These are the goals we discussed: Goals    . Weight (lb) < 165 lb (74.8 kg)       This is a list of the screening recommended for you and due dates:  Health Maintenance  Topic Date Due  . Flu Shot  07/03/2021  . COVID-19 Vaccine (4 - Booster for [place] series) 07/30/2021  . Tetanus Vaccine  07/11/2022  . Pap Smear  02/08/2024  .  Hepatitis C: One time screening is recommended by Center for Disease Control  (CDC) for  adults born from [birth year] through 1965.   Completed  . HIV Screening  Completed  . HPV Vaccine  Aged Out     Know what a healthy weight is for you (roughly BMI <25) and aim to maintain this  Aim for 7+ servings of fruits and vegetables daily  65-80+ fluid ounces of water or unsweet tea for healthy kidneys  Limit to max 1 drink of alcohol per day; avoid smoking/tobacco  Limit animal fats in diet for cholesterol and heart health - choose grass fed whenever available  Avoid highly processed foods, and foods high in saturated/trans fats  Aim for low stress - take time to unwind and care for your mental health  Aim for 150 min of moderate intensity exercise weekly for heart health, and weights twice weekly for bone health  Aim for 7-9 hours of sleep daily       High-Fiber Eating Plan Fiber, also called dietary fiber, is a type of carbohydrate. It is found foods such as fruits, vegetables, whole grains, and beans. A high-fiber diet can have many health benefits. Your health care provider may recommend a high-fiber diet to help:  Prevent constipation. Fiber can make your bowel movements more regular.  Lower your cholesterol.  Relieve the following conditions: ? Inflammation of veins in the anus (hemorrhoids). ? Inflammation of  specific areas of the digestive tract (uncomplicated diverticulosis). ? A problem of the large intestine, also called the colon, that sometimes causes pain and diarrhea (irritable bowel syndrome, or IBS).  Prevent overeating as part of a weight-loss plan.  Prevent heart disease, type 2 diabetes, and certain cancers. What are tips for following this plan? Reading food labels  Check the nutrition facts label on food products for the amount of dietary fiber. Choose foods that have 5 grams of fiber or more per serving.  The goals for recommended daily fiber intake include: ? Men (age 46 or younger): 34-38 g. ? Men (over age 73): 28-34 g. ? Women (age 29 or younger): 25-28 g. ? Women (over age 69): 22-25 g. Your daily fiber goal is _____________ g.   Shopping  Choose whole fruits and vegetables instead of processed forms, such as apple juice or applesauce.  Choose a wide variety of high-fiber foods such as avocados, lentils, oats, and kidney beans.  Read the nutrition facts label of the foods you choose. Be aware of foods with added fiber. These foods often have high sugar and sodium amounts per serving. Cooking  Use whole-grain flour for baking and cooking.  Cook with brown rice instead of white rice. Meal planning  Start the day with a breakfast that is high in fiber, such as a cereal that  contains 5 g of fiber or more per serving.  Eat breads and cereals that are made with whole-grain flour instead of refined flour or white flour.  Eat brown rice, bulgur wheat, or millet instead of white rice.  Use beans in place of meat in soups, salads, and pasta dishes.  Be sure that half of the grains you eat each day are whole grains. General information  You can get the recommended daily intake of dietary fiber by: ? Eating a variety of fruits, vegetables, grains, nuts, and beans. ? Taking a fiber supplement if you are not able to take in enough fiber in your diet. It is better to get  fiber through food than from a supplement.  Gradually increase how much fiber you consume. If you increase your intake of dietary fiber too quickly, you may have bloating, cramping, or gas.  Drink plenty of water to help you digest fiber.  Choose high-fiber snacks, such as berries, raw vegetables, nuts, and popcorn. What foods should I eat? Fruits Berries. Pears. Apples. Oranges. Avocado. Prunes and raisins. Dried figs. Vegetables Sweet potatoes. Spinach. Kale. Artichokes. Cabbage. Broccoli. Cauliflower. Green peas. Carrots. Squash. Grains Whole-grain breads. Multigrain cereal. Oats and oatmeal. Brown rice. Barley. Bulgur wheat. [employer]. Quinoa. Bran muffins. Popcorn. Rye wafer crackers. Meats and other proteins Navy beans, kidney beans, and pinto beans. Soybeans. Split peas. Lentils. Nuts and seeds. Dairy Fiber-fortified yogurt. Beverages Fiber-fortified soy milk. Fiber-fortified orange juice. Other foods Fiber bars. The items listed above may not be a complete list of recommended foods and beverages. Contact a dietitian for more information. What foods should I avoid? Fruits Fruit juice. Cooked, strained fruit. Vegetables Fried potatoes. Canned vegetables. Well-cooked vegetables. Grains White bread. Pasta made with refined flour. White rice. Meats and other proteins Fatty cuts of meat. Fried chicken or fried fish. Dairy Milk. Yogurt. Cream cheese. Sour cream. Fats and oils Butters. Beverages Soft drinks. Other foods Cakes and pastries. The items listed above may not be a complete list of foods and beverages to avoid. Talk with your dietitian about what choices are best for you. Summary  Fiber is a type of carbohydrate. It is found in foods such as fruits, vegetables, whole grains, and beans.  A high-fiber diet has many benefits. It can help to prevent constipation, lower blood cholesterol, aid weight loss, and reduce your risk of heart disease, diabetes, and certain  cancers.  Increase your intake of fiber gradually. Increasing fiber too quickly may cause cramping, bloating, and gas. Drink plenty of water while you increase the amount of fiber you consume.  The best sources of fiber include whole fruits and vegetables, whole grains, nuts, seeds, and beans. This information is not intended to replace advice given to you by your health care provider. Make sure you discuss any questions you have with your health care provider. Document Revised: 03/24/2020 Document Reviewed: 03/24/2020 Elsevier Patient Education  2021 [employer].

## 2021-03-16 ENCOUNTER — Other Ambulatory Visit: Payer: Self-pay | Admitting: Adult Health

## 2021-03-16 DIAGNOSIS — N1832 Chronic kidney disease, stage 3b: Secondary | ICD-10-CM

## 2021-03-16 DIAGNOSIS — N289 Disorder of kidney and ureter, unspecified: Secondary | ICD-10-CM

## 2021-03-16 LAB — COMPLETE METABOLIC PANEL WITH GFR
AG Ratio: 1.6 (calc) (ref 1.0–2.5)
ALT: 18 U/L (ref 6–29)
AST: 18 U/L (ref 10–30)
Albumin: 4.2 g/dL (ref 3.6–5.1)
Alkaline phosphatase (APISO): 56 U/L (ref 31–125)
BUN/Creatinine Ratio: 20 (calc) (ref 6–22)
BUN: 43 mg/dL — ABNORMAL HIGH (ref 7–25)
CO2: 22 mmol/L (ref 20–32)
Calcium: 9.3 mg/dL (ref 8.6–10.2)
Chloride: 106 mmol/L (ref 98–110)
Creat: 2.15 mg/dL — ABNORMAL HIGH (ref 0.50–1.10)
GFR, Est African American: 31 mL/min/{1.73_m2} — ABNORMAL LOW (ref >=60)
GFR, Est Non African American: 27 mL/min/{1.73_m2} — ABNORMAL LOW (ref >=60)
Globulin: 2.7 g/dL (calc) (ref 1.9–3.7)
Glucose, Bld: 73 mg/dL (ref 65–99)
Potassium: 5 mmol/L (ref 3.5–5.3)
Sodium: 138 mmol/L (ref 135–146)
Total Bilirubin: 0.6 mg/dL (ref 0.2–1.2)
Total Protein: 6.9 g/dL (ref 6.1–8.1)

## 2021-03-16 LAB — URINALYSIS, ROUTINE W REFLEX MICROSCOPIC
Bilirubin Urine: NEGATIVE
Glucose, UA: NEGATIVE
Hgb urine dipstick: NEGATIVE
Ketones, ur: NEGATIVE
Leukocytes,Ua: NEGATIVE
Nitrite: NEGATIVE
Protein, ur: NEGATIVE
Specific Gravity, Urine: 1.012 (ref 1.001–1.03)
pH: 6 (ref 5.0–8.0)

## 2021-03-16 LAB — CBC WITH DIFFERENTIAL/PLATELET
Absolute Monocytes: 530 cells/uL (ref 200–950)
Basophils Absolute: 31 cells/uL (ref 0–200)
Basophils Relative: 0.6 %
Eosinophils Absolute: 78 cells/uL (ref 15–500)
Eosinophils Relative: 1.5 %
HCT: 38.4 % (ref 35.0–45.0)
Hemoglobin: 12.5 g/dL (ref 11.7–15.5)
Lymphs Abs: 1394 cells/uL (ref 850–3900)
MCH: 27.9 pg (ref 27.0–33.0)
MCHC: 32.6 g/dL (ref 32.0–36.0)
MCV: 85.7 fL (ref 80.0–100.0)
MPV: 11.4 fL (ref 7.5–12.5)
Monocytes Relative: 10.2 %
Neutro Abs: 3167 cells/uL (ref 1500–7800)
Neutrophils Relative %: 60.9 %
Platelets: 194 10*3/uL (ref 140–400)
RBC: 4.48 10*6/uL (ref 3.80–5.10)
RDW: 12.1 % (ref 11.0–15.0)
Total Lymphocyte: 26.8 %
WBC: 5.2 10*3/uL (ref 3.8–10.8)

## 2021-03-16 LAB — VITAMIN D 25 HYDROXY (VIT D DEFICIENCY, FRACTURES): Vit D, 25-Hydroxy: 62 ng/mL (ref 30–100)

## 2021-03-16 LAB — LIPID PANEL
Cholesterol: 177 mg/dL (ref <200)
HDL: 44 mg/dL — ABNORMAL LOW (ref >=50)
LDL Cholesterol (Calc): 108 mg/dL (calc) — ABNORMAL HIGH
Non-HDL Cholesterol (Calc): 133 mg/dL (calc) — ABNORMAL HIGH (ref <130)
Total CHOL/HDL Ratio: 4 (calc) (ref <5.0)
Triglycerides: 131 mg/dL (ref <150)

## 2021-03-16 LAB — MAGNESIUM: Magnesium: 2.2 mg/dL (ref 1.5–2.5)

## 2021-03-16 LAB — MICROALBUMIN / CREATININE URINE RATIO
Creatinine, Urine: 65 mg/dL (ref 20–275)
Microalb Creat Ratio: 15 mcg/mg creat (ref <30)
Microalb, Ur: 1 mg/dL

## 2021-03-16 LAB — PARATHYROID HORMONE, INTACT (NO CA): PTH: 50 pg/mL (ref 16–77)

## 2021-03-16 LAB — TSH: TSH: 2.24 mIU/L

## 2021-04-10 ENCOUNTER — Other Ambulatory Visit: Payer: Self-pay

## 2021-04-10 ENCOUNTER — Other Ambulatory Visit: Payer: BC Managed Care – PPO

## 2021-04-10 DIAGNOSIS — N1832 Chronic kidney disease, stage 3b: Secondary | ICD-10-CM | POA: Diagnosis not present

## 2021-04-10 DIAGNOSIS — N289 Disorder of kidney and ureter, unspecified: Secondary | ICD-10-CM | POA: Diagnosis not present

## 2021-04-10 LAB — BASIC METABOLIC PANEL WITH GFR
BUN/Creatinine Ratio: 22 (calc) (ref 6–22)
BUN: 42 mg/dL — ABNORMAL HIGH (ref 7–25)
CO2: 24 mmol/L (ref 20–32)
Calcium: 9.4 mg/dL (ref 8.6–10.2)
Chloride: 103 mmol/L (ref 98–110)
Creat: 1.92 mg/dL — ABNORMAL HIGH (ref 0.50–1.10)
GFR, Est African American: 36 mL/min/{1.73_m2} — ABNORMAL LOW (ref >=60)
GFR, Est Non African American: 31 mL/min/{1.73_m2} — ABNORMAL LOW (ref >=60)
Glucose, Bld: 75 mg/dL (ref 65–99)
Potassium: 5.1 mmol/L (ref 3.5–5.3)
Sodium: 135 mmol/L (ref 135–146)

## 2021-05-15 DIAGNOSIS — R109 Unspecified abdominal pain: Secondary | ICD-10-CM | POA: Diagnosis not present

## 2021-05-15 DIAGNOSIS — Z03818 Encounter for observation for suspected exposure to other biological agents ruled out: Secondary | ICD-10-CM | POA: Diagnosis not present

## 2021-05-15 DIAGNOSIS — R197 Diarrhea, unspecified: Secondary | ICD-10-CM | POA: Diagnosis not present

## 2021-05-24 ENCOUNTER — Other Ambulatory Visit: Payer: Self-pay | Admitting: Internal Medicine

## 2021-09-15 ENCOUNTER — Other Ambulatory Visit: Payer: Self-pay | Admitting: Internal Medicine

## 2021-09-15 DIAGNOSIS — I150 Renovascular hypertension: Secondary | ICD-10-CM

## 2021-09-15 MED ORDER — ENALAPRIL MALEATE 5 MG PO TABS: ORAL_TABLET | ORAL | 3 refills | Status: DC

## 2021-12-05 ENCOUNTER — Encounter: Payer: Self-pay | Admitting: Adult Health

## 2021-12-06 ENCOUNTER — Other Ambulatory Visit: Payer: Self-pay | Admitting: Adult Health

## 2021-12-06 DIAGNOSIS — N1832 Chronic kidney disease, stage 3b: Secondary | ICD-10-CM

## 2021-12-11 ENCOUNTER — Other Ambulatory Visit: Payer: Self-pay

## 2021-12-11 ENCOUNTER — Other Ambulatory Visit: Payer: BC Managed Care – PPO

## 2021-12-11 DIAGNOSIS — N1832 Chronic kidney disease, stage 3b: Secondary | ICD-10-CM

## 2021-12-12 ENCOUNTER — Other Ambulatory Visit: Payer: Self-pay | Admitting: Adult Health

## 2021-12-12 ENCOUNTER — Encounter: Payer: Self-pay | Admitting: Adult Health

## 2021-12-12 DIAGNOSIS — N184 Chronic kidney disease, stage 4 (severe): Secondary | ICD-10-CM

## 2021-12-12 LAB — BASIC METABOLIC PANEL WITH GFR
BUN/Creatinine Ratio: 15 (calc) (ref 6–22)
BUN: 33 mg/dL — ABNORMAL HIGH (ref 7–25)
CO2: 22 mmol/L (ref 20–32)
Calcium: 9.2 mg/dL (ref 8.6–10.2)
Chloride: 102 mmol/L (ref 98–110)
Creat: 2.15 mg/dL — ABNORMAL HIGH (ref 0.50–0.99)
Glucose, Bld: 77 mg/dL (ref 65–99)
Potassium: 4.8 mmol/L (ref 3.5–5.3)
Sodium: 136 mmol/L (ref 135–146)
eGFR: 28 mL/min/{1.73_m2} — ABNORMAL LOW (ref >=60)

## 2021-12-12 NOTE — Addendum Note (Signed)
Addended by: Izora Ribas on: 12/12/2021 05:33 PM   Modules accepted: Orders

## 2022-01-31 DIAGNOSIS — D631 Anemia in chronic kidney disease: Secondary | ICD-10-CM | POA: Diagnosis not present

## 2022-01-31 DIAGNOSIS — N2581 Secondary hyperparathyroidism of renal origin: Secondary | ICD-10-CM | POA: Diagnosis not present

## 2022-01-31 DIAGNOSIS — N184 Chronic kidney disease, stage 4 (severe): Secondary | ICD-10-CM | POA: Diagnosis not present

## 2022-01-31 DIAGNOSIS — N189 Chronic kidney disease, unspecified: Secondary | ICD-10-CM | POA: Diagnosis not present

## 2022-01-31 DIAGNOSIS — I129 Hypertensive chronic kidney disease with stage 1 through stage 4 chronic kidney disease, or unspecified chronic kidney disease: Secondary | ICD-10-CM | POA: Diagnosis not present

## 2022-02-01 ENCOUNTER — Other Ambulatory Visit: Payer: Self-pay | Admitting: Nephrology

## 2022-02-01 DIAGNOSIS — I129 Hypertensive chronic kidney disease with stage 1 through stage 4 chronic kidney disease, or unspecified chronic kidney disease: Secondary | ICD-10-CM

## 2022-02-01 DIAGNOSIS — N184 Chronic kidney disease, stage 4 (severe): Secondary | ICD-10-CM

## 2022-02-01 DIAGNOSIS — N189 Chronic kidney disease, unspecified: Secondary | ICD-10-CM

## 2022-02-01 DIAGNOSIS — D631 Anemia in chronic kidney disease: Secondary | ICD-10-CM

## 2022-02-01 DIAGNOSIS — N2581 Secondary hyperparathyroidism of renal origin: Secondary | ICD-10-CM

## 2022-02-12 ENCOUNTER — Ambulatory Visit
Admission: RE | Admit: 2022-02-12 | Discharge: 2022-02-12 | Disposition: A | Discharge status: Home / Self Care | Payer: BC Managed Care – PPO | Source: Ambulatory Visit | Attending: Nephrology | Admitting: Nephrology

## 2022-02-12 DIAGNOSIS — I129 Hypertensive chronic kidney disease with stage 1 through stage 4 chronic kidney disease, or unspecified chronic kidney disease: Secondary | ICD-10-CM

## 2022-02-12 DIAGNOSIS — N184 Chronic kidney disease, stage 4 (severe): Secondary | ICD-10-CM | POA: Diagnosis not present

## 2022-02-12 DIAGNOSIS — N189 Chronic kidney disease, unspecified: Secondary | ICD-10-CM

## 2022-02-12 DIAGNOSIS — D631 Anemia in chronic kidney disease: Secondary | ICD-10-CM

## 2022-02-12 DIAGNOSIS — N2581 Secondary hyperparathyroidism of renal origin: Secondary | ICD-10-CM

## 2022-03-15 ENCOUNTER — Ambulatory Visit: Payer: BC Managed Care – PPO | Admitting: Adult Health

## 2022-03-15 ENCOUNTER — Encounter: Payer: Self-pay | Admitting: Adult Health

## 2022-03-15 VITALS — BP 110/78 | HR 77 | Temp 97.5°F | Ht 63.5 in | Wt 175.6 lb

## 2022-03-15 DIAGNOSIS — Z1211 Encounter for screening for malignant neoplasm of colon: Secondary | ICD-10-CM

## 2022-03-15 DIAGNOSIS — Z0000 Encounter for general adult medical examination without abnormal findings: Secondary | ICD-10-CM

## 2022-03-15 DIAGNOSIS — Z23 Encounter for immunization: Secondary | ICD-10-CM

## 2022-03-15 DIAGNOSIS — Z131 Encounter for screening for diabetes mellitus: Secondary | ICD-10-CM | POA: Diagnosis not present

## 2022-03-15 DIAGNOSIS — I150 Renovascular hypertension: Secondary | ICD-10-CM

## 2022-03-15 DIAGNOSIS — Z1329 Encounter for screening for other suspected endocrine disorder: Secondary | ICD-10-CM

## 2022-03-15 DIAGNOSIS — E669 Obesity, unspecified: Secondary | ICD-10-CM

## 2022-03-15 DIAGNOSIS — Z13220 Encounter for screening for lipoid disorders: Secondary | ICD-10-CM | POA: Diagnosis not present

## 2022-03-15 DIAGNOSIS — Z85830 Personal history of malignant neoplasm of bone: Secondary | ICD-10-CM

## 2022-03-15 DIAGNOSIS — I10 Essential (primary) hypertension: Secondary | ICD-10-CM

## 2022-03-15 DIAGNOSIS — Z0001 Encounter for general adult medical examination with abnormal findings: Secondary | ICD-10-CM

## 2022-03-15 DIAGNOSIS — E785 Hyperlipidemia, unspecified: Secondary | ICD-10-CM

## 2022-03-15 DIAGNOSIS — E559 Vitamin D deficiency, unspecified: Secondary | ICD-10-CM | POA: Diagnosis not present

## 2022-03-15 DIAGNOSIS — R7309 Other abnormal glucose: Secondary | ICD-10-CM

## 2022-03-15 DIAGNOSIS — Z136 Encounter for screening for cardiovascular disorders: Secondary | ICD-10-CM

## 2022-03-15 DIAGNOSIS — Z79899 Other long term (current) drug therapy: Secondary | ICD-10-CM | POA: Diagnosis not present

## 2022-03-15 DIAGNOSIS — N184 Chronic kidney disease, stage 4 (severe): Secondary | ICD-10-CM

## 2022-03-15 DIAGNOSIS — B351 Tinea unguium: Secondary | ICD-10-CM

## 2022-03-15 DIAGNOSIS — Z130 Encounter for screening for diseases of the blood and blood-forming organs and certain disorders involving the immune mechanism: Secondary | ICD-10-CM | POA: Diagnosis not present

## 2022-03-15 DIAGNOSIS — E538 Deficiency of other specified B group vitamins: Secondary | ICD-10-CM

## 2022-03-15 DIAGNOSIS — E66811 Obesity, class 1: Secondary | ICD-10-CM

## 2022-03-15 DIAGNOSIS — Z1389 Encounter for screening for other disorder: Secondary | ICD-10-CM | POA: Diagnosis not present

## 2022-03-15 MED ORDER — EFINACONAZOLE 10 % EX SOLN: 1.0000 "application " | Freq: Every day | CUTANEOUS | 1 refills | Status: DC

## 2022-03-15 NOTE — Patient Instructions (Addendum)
?Ms. Ess , ?Thank you for taking time to come for your Annual Wellness Visit. I appreciate your ongoing commitment to your health goals. Please review the following plan we discussed and let me know if I can assist you in the future.  ? ?These are the goals we discussed: ? Goals   ? ?  DIET - EAT MORE FRUITS AND VEGETABLES   ?  Work up to 3 servings daily  ?  ?  Exercise 3x per week (20 min per time)   ?  Focus on weights/resistance training) ?  ?  Weight (lb) < 165 lb (74.8 kg)   ? ?  ?  ?This is a list of the screening recommended for you and due dates:  ?Health Maintenance  ?Topic Date Due  ? Colon Cancer Screening  Never done  ? COVID-19 Vaccine (6 - Booster for Pfizer series) 03/31/2022*  ? Flu Shot  07/03/2022  ? Tetanus Vaccine  07/11/2022  ? Pap Smear  02/08/2024  ? Hepatitis C Screening: USPSTF Recommendation to screen - Ages 72-79 yo.  Completed  ? HIV Screening  Completed  ? HPV Vaccine  Aged Out  ?*Topic was postponed. The date shown is not the original due date.  ? ? ?Know what a healthy weight is for you (roughly BMI <25) and aim to maintain this ? ?Aim for 7+ servings of fruits and vegetables daily ? ?65-80+ fluid ounces of water or unsweet tea for healthy kidneys ? ?Limit to max 1 drink of alcohol per day; avoid smoking/tobacco ? ?Limit animal fats in diet for cholesterol and heart health - choose grass fed whenever available ? ?Avoid highly processed foods, and foods high in saturated/trans fats ? ?Aim for low stress - take time to unwind and care for your mental health ? ?Aim for 150 min of moderate intensity exercise weekly for heart health, and weights twice weekly for bone health ? ?Aim for 7-9 hours of sleep daily ? ? ? ? ? ? ?Efinaconazole Topical Solution ?What is this medication? ?EFINACONAZOLE (e FEE na KON a zole) is an antifungal medicine. It is used to treat certain kinds of fungal infections of the toenail. ?This medicine may be used for other purposes; ask your health care provider  or pharmacist if you have questions. ?COMMON BRAND NAME(S): JUBLIA ?What should I tell my care team before I take this medication? ?They need to know if you have any of these conditions: ?an unusual or allergic reaction to efinaconazole, other medicines, foods, dyes or preservatives ?pregnant or trying to get pregnant ?breast-feeding ?How should I use this medication? ?This medicine is for external use only. Do not take by mouth. Follow the directions on the label. Wash hands before and after use. Apply this medicine using the provided brush to cover the entire toenail. Do not use your medicine more often than directed. Finish the full course prescribed by your doctor or health care professional even if you think your condition is better. Do not stop using except on the advice of your doctor or health care professional. ?Talk to your pediatrician regarding the use of this medicine in children. While this drug may be prescribed for children as young as 6 years for selected conditions, precautions do apply. ?Overdosage: If you think you have taken too much of this medicine contact a poison control center or emergency room at once. ?NOTE: This medicine is only for you. Do not share this medicine with others. ?What if I miss a dose? ?  If you miss a dose, use it as soon as you can. If it is almost time for your next dose, use only that dose. Do not use double or extra doses. ?What may interact with this medication? ?Interactions have not been studied. Do not use any other nail products (i.e., nail polish, pedicures) during treatment with this medicine. ?This list may not describe all possible interactions. Give your health care provider a list of all the medicines, herbs, non-prescription drugs, or dietary supplements you use. Also tell them if you smoke, drink alcohol, or use illegal drugs. Some items may interact with your medicine. ?What should I watch for while using this medication? ?Do not get this medicine in your  eyes. If you do, rinse out with plenty of cool tap water. ?Tell your doctor or health care professional if your symptoms do not start to get better or if they get worse. ?Wait for at least 10 minutes after bathing before applying this medication. After bathing, make sure that your feet are very dry. Fungal infections like moist conditions. Do not walk around barefoot. To help prevent reinfection, wear freshly washed cotton, not synthetic clothing. Tell your doctor or health care professional if you develop sores or blisters that do not heal properly. If your nail infection returns after you stop using this medicine, contact your doctor or health care professional. ?What side effects may I notice from receiving this medication? ?Side effects that you should report to your doctor or health care professional as soon as possible: ?allergic reactions like skin rash, itching or hives, swelling of the face, lips, or tongue ?ingrown toenail ?Side effects that usually do not require medical attention (report to your doctor or health care professional if they continue or are bothersome): ?mild skin irritation, burning, or itching ?This list may not describe all possible side effects. Call your doctor for medical advice about side effects. You may report side effects to FDA at 1-800-FDA-1088. ?Where should I keep my medication? ?Keep out of the reach of children. ?Store at room temperature between 20 and 25 degrees C (68 and 77 degrees F). Keep this medicine in the original container. Throw away any unused medicine after the expiration date. ?This medicine is flammable. Avoid exposure to heat, fire, flame, and smoking. ?NOTE: This sheet is a summary. It may not cover all possible information. If you have questions about this medicine, talk to your doctor, pharmacist, or health care provider. ?? 2022 Elsevier/Gold Standard (2019-04-01 00:00:00) ? ? ? ? ? ?

## 2022-03-15 NOTE — Progress Notes (Signed)
Complete Physical ? ?Assessment and Plan: ? ?Encounter for Annual Physical Exam with abnormal findings ?Due annually  ?Health Maintenance reviewed ?Healthy lifestyle reviewed and goals set ? ?Renovascular hypertension ?- continue medications, DASH diet, exercise and monitor at home. Call if greater than 130/80.  ?-     CBC with Differential/Platelet ?-     CMP/GFR ?-     TSH ?-     Urinalysis, Routine w reflex microscopic ?-     Microalbumin / creatinine urine ratio ?-     EKG ? ?Chronic kidney disease (CKD), stage 4 (HCC) ?Progressed from CKD 3b and re-established with [facility] ?Continue enalapril  ?Increase fluids, avoid NSAIDS, monitor sugars, will monitor ?Had normal PTH 2022 ?-     CMP/GFR ?-     UA, Micralbumin ? ?Hyperlipidemia, unspecified hyperlipidemia type ?-mild elevations managed by lifestyle, check lipids, decrease fatty foods, increase activity.  ?-     Lipid panel ? ?Abnormal glucose ?- A1C ? ?Medication management ?-     Magnesium ? ?Vitamin D deficiency ?-     VITAMIN D 25 Hydroxy (Vit-D Deficiency, Fractures) ? ?History of osteosarcoma ?[facility] following q2y; monitor  ? ?Obesity with co morbidities - BMI 30 ?- long discussion about weight loss, diet, and exercise ?- weight loss goal of <165 lb discussed and set ?- increase fiber in diet ?- lean protein, increase fruits/veggies, limit processed ?- add resistance exercise for long term metabolism ? ?Onycomycosis ?- efinaconazole 10 % topical therapy daily x 48 weeks ? ?Need for tetanus booster ?- Td administered without complication.  ? ?Colon cancer screening ?- GI referral for colonoscopy placed after discussion  ? ? ?Orders Placed This Encounter  ?Procedures  ? CBC with Differential/Platelet  ? COMPLETE METABOLIC PANEL WITH GFR  ? Magnesium  ? Lipid panel  ? TSH  ? Hemoglobin A1c  ? VITAMIN D 25 Hydroxy (Vit-D Deficiency, Fractures)  ? Vitamin B12  ? Microalbumin / creatinine urine ratio  ? Urinalysis, Routine w reflex microscopic  ?  Ambulatory referral to Gastroenterology  ? EKG 12-Lead  ? ? ? ?Discussed med's effects and SE's. Screening labs and tests as requested with regular follow-up as recommended. ?Over 40 minutes of exam, counseling, chart review, and complex, high level critical decision making was performed this visit.  ? ?Future Appointments  ?Date Time Provider [place]  ?03/19/2023  9:00 AM Liane Comber, NP [facility] None  ? ? ? ?HPI  ?46 y.o. female  presents for a complete physical and follow up for has History of osteosarcoma; Hypertension; Hyperlipidemia; Abnormal glucose; Medication management; Vitamin D deficiency; Obesity (BMI 30.0-34.9); Eczema of both hands; Bilateral sensorineural hearing loss; and CKD (chronic kidney disease) stage 4, GFR 15-29 ml/min (HCC) on their problem list.. ? ?She is married, she has twins that, 2 boy and girl, doing well. 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  ? ?She follows with Dr. Corinna Capra at [facility], goes annually. 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 Getting annual mammograms there.  ? ?She had history of osteosarcoma s/p chem in 2000, follows with [facility] onc q2y. Chemotherapy resulted in CKD IIIb, proteinuria resolved on ACEi, BP controlled. Had normal PTH. Progressed to stage 4 and now re-established with [facility], has follow up next week.  ? ?She is on celexa for OCD/Anxiety and doing well. Didn't do well stopping med several years ago, hasn't tried reduced dose of 20 mg, receptive to trial.  ? 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 Unice Bailey and recommended hearing aids, got in 12/2020 and doing well.  ? ?BMI is Body mass index is 30.62 kg/m?., she has been working on diet and exercise. She is trying to walk as much as possible, at least 1/2 mile per day, 2-3 miles on the weekend.   ?She is avoid soda and fried foods. Does lots of chicken, red meat 2-3 times. Reports 1-2 veggies per day, minimal fruit.  ?1 cup  of coffee, 5-6 glasses of water.  ?Wt Readings from Last 3 Encounters:  ?03/15/22 175 lb 9.6 oz (79.7 kg)  ?03/15/21 172 lb 3.2 oz (78.1 kg)  ?03/15/20 174 lb 3.2 oz (79 kg)  ? ?Her blood pressure has been controlled at home, on enalapril 5 mg daily, today their BP is BP: 110/78  ?She does workout,   She denies chest pain, shortness of breath, dizziness. ? ?She is not on cholesterol medication and denies myalgias. Her cholesterol is not at goal. The cholesterol last visit was:   ?Lab Results  ?Component Value Date  ? CHOL 177 03/15/2021  ? HDL 44 (L) 03/15/2021  ? LDLCALC 108 (H) 03/15/2021  ? TRIG 131 03/15/2021  ? CHOLHDL 4.0 03/15/2021  ? ? Last A1C in the office was:  ?Lab Results  ?Component Value Date  ? HGBA1C 4.8 01/24/2017  ? ?CKD 4 (chemotherapy), now following with [facility] Dr. Posey Pronto. Has follow up planned May 2023. Last GFR: ?Lab Results  ?Component Value Date  ? EGFR 28 (L) 12/11/2021  ? ?Patient is on Vitamin D supplement, 4000 IU daily  ?Lab Results  ?Component Value Date  ? VD25OH 62 03/15/2021  ?   ?Lab Results  ?Component Value Date  ? QQIWLNLG92 377 01/24/2017  ? ? ? ?Current Medications:  ?Current Outpatient Medications on File Prior to Visit  ?Medication Sig Dispense Refill  ? amoxicillin (AMOXIL) 500 MG capsule Take      2 capsules   1 to 2 hours        prior to Dental Procedure 30 capsule 0  ? Cholecalciferol 100 MCG (4000 UT) CAPS Take 1 capsule by mouth daily.    ? citalopram (CELEXA) 40 MG tablet Take  1 tablet  Daily for Mood 90 tablet 3  ? enalapril (VASOTEC) 5 MG tablet Take  1 tablet  Daily  for BP & Kidney Protection                          /          TAKE 1 TABLET BY MOUTH DAILY FOR..... 90 tablet 3  ? norethindrone-ethinyl estradiol-iron (MICROGESTIN FE,GILDESS FE,LOESTRIN FE) 1.5-30 MG-MCG tablet Take 1 tablet by mouth daily.    ? ?No current facility-administered medications on file prior to visit.  ? ?Allergies:  ?No Known Allergies  ? ?Medical History:  ?She has History  of osteosarcoma; Hypertension; Hyperlipidemia; Abnormal glucose; Medication management; Vitamin D deficiency; Obesity (BMI 30.0-34.9); Eczema of both hands; Bilateral sensorineural hearing loss; and CKD (chronic kidney disease) stage 4, GFR 15-29 ml/min (HCC) on their problem list.  ? ?Health Maintenance:   ?Immunization History  ?Administered Date(s) Administered  ? Influenza, Quadrivalent, Recombinant, Inj, Pf 09/27/2019  ? Influenza-Unspecified 10/06/2017, 10/28/2021  ? PFIZER Comirnaty(Gray Top)Covid-19 Tri-Sucrose Vaccine 11/20/2021  ? PFIZER(Purple Top)SARS-COV-2 Vaccination 02/16/2020, 03/08/2020, 10/18/2020, 01/30/2021  ? PPD Test 11/24/2013, 11/29/2014, 12/12/2015  ? Pneumococcal Polysaccharide-23 11/24/2013  ? Tdap  07/11/2012  ? ?Health Maintenance  ?Topic Date Due  ? COLONOSCOPY (Pts 45-71yr Insurance coverage will need to be confirmed)  Never done  ? COVID-19 Vaccine (6 - Booster for PViennaseries) 03/31/2022 (Originally 01/15/2022)  ? INFLUENZA VACCINE  07/03/2022  ? TETANUS/TDAP  07/11/2022  ? PAP SMEAR-Modifier  02/08/2024  ? Hepatitis C Screening  Completed  ? HIV Screening  Completed  ? HPV VACCINES  Aged Out  ? ?Tetanus: 07/2012 - patient preference to boost today, Td ? ?No LMP recorded. ?Pap: 02/07/2021 Dr. LCorinna Capra has upcoming  ?MGM: 02/07/2021 at Dr. LCorinna Capra has upcoming  ?DEXA: 11/2020, normal  ? ?Colonoscopy: due 2023, referrral placed to GI ?EGD: N/A ? ?Last Dental Exam: goes q643mnext 04/2022 ?Last Eye Exam: Dr. WeRock Nephew2022, no changes in vision, just has readers ? ?Patient Care Team: ?McUnk PintoMD as PCP - General (Internal Medicine) ?BaMelida QuitterMD as Consulting Physician (Otolaryngology) ? ?Surgical History:  ?She has a past surgical history that includes Right leg replacement; LEEP; Tumor removal (Right, 2011); Cesarean section (09/08/2012); Patella fracture surgery (Right, 12/2019); and Wisdom tooth extraction. ?Family History:  ?Herfamily history includes Colon cancer in her paternal  great-grandmother; Diabetes in her father; Hyperlipidemia in her maternal grandfather; Hypertension in her father and maternal grandfather; Lung cancer in her paternal grandmother. ?Social History:  ?She report

## 2022-03-16 ENCOUNTER — Encounter: Payer: Self-pay | Admitting: Adult Health

## 2022-03-16 ENCOUNTER — Other Ambulatory Visit: Payer: Self-pay | Admitting: Adult Health

## 2022-03-16 DIAGNOSIS — B351 Tinea unguium: Secondary | ICD-10-CM

## 2022-03-16 LAB — LIPID PANEL
Cholesterol: 203 mg/dL — ABNORMAL HIGH (ref <200)
HDL: 55 mg/dL (ref >=50)
LDL Cholesterol (Calc): 120 mg/dL (calc) — ABNORMAL HIGH
Non-HDL Cholesterol (Calc): 148 mg/dL (calc) — ABNORMAL HIGH (ref <130)
Total CHOL/HDL Ratio: 3.7 (calc) (ref <5.0)
Triglycerides: 160 mg/dL — ABNORMAL HIGH (ref <150)

## 2022-03-16 LAB — URINALYSIS, ROUTINE W REFLEX MICROSCOPIC
Bilirubin Urine: NEGATIVE
Glucose, UA: NEGATIVE
Hgb urine dipstick: NEGATIVE
Ketones, ur: NEGATIVE
Leukocytes,Ua: NEGATIVE
Nitrite: NEGATIVE
Protein, ur: NEGATIVE
Specific Gravity, Urine: 1.004 (ref 1.001–1.035)
pH: 6 (ref 5.0–8.0)

## 2022-03-16 LAB — HEMOGLOBIN A1C
Hgb A1c MFr Bld: 5 % of total Hgb (ref <5.7)
Mean Plasma Glucose: 97 mg/dL
eAG (mmol/L): 5.4 mmol/L

## 2022-03-16 LAB — CBC WITH DIFFERENTIAL/PLATELET
Absolute Monocytes: 589 cells/uL (ref 200–950)
Basophils Absolute: 12 cells/uL (ref 0–200)
Basophils Relative: 0.2 %
Eosinophils Absolute: 68 cells/uL (ref 15–500)
Eosinophils Relative: 1.1 %
HCT: 39.8 % (ref 35.0–45.0)
Hemoglobin: 13.4 g/dL (ref 11.7–15.5)
Lymphs Abs: 1066 cells/uL (ref 850–3900)
MCH: 28.9 pg (ref 27.0–33.0)
MCHC: 33.7 g/dL (ref 32.0–36.0)
MCV: 86 fL (ref 80.0–100.0)
MPV: 11.4 fL (ref 7.5–12.5)
Monocytes Relative: 9.5 %
Neutro Abs: 4464 cells/uL (ref 1500–7800)
Neutrophils Relative %: 72 %
Platelets: 204 10*3/uL (ref 140–400)
RBC: 4.63 10*6/uL (ref 3.80–5.10)
RDW: 12.3 % (ref 11.0–15.0)
Total Lymphocyte: 17.2 %
WBC: 6.2 10*3/uL (ref 3.8–10.8)

## 2022-03-16 LAB — MICROALBUMIN / CREATININE URINE RATIO
Creatinine, Urine: 14 mg/dL — ABNORMAL LOW (ref 20–275)
Microalb Creat Ratio: 57 mcg/mg creat — ABNORMAL HIGH (ref <30)
Microalb, Ur: 0.8 mg/dL

## 2022-03-16 LAB — VITAMIN D 25 HYDROXY (VIT D DEFICIENCY, FRACTURES): Vit D, 25-Hydroxy: 72 ng/mL (ref 30–100)

## 2022-03-16 LAB — COMPLETE METABOLIC PANEL WITH GFR
AG Ratio: 1.4 (calc) (ref 1.0–2.5)
ALT: 21 U/L (ref 6–29)
AST: 19 U/L (ref 10–35)
Albumin: 4.3 g/dL (ref 3.6–5.1)
Alkaline phosphatase (APISO): 63 U/L (ref 31–125)
BUN/Creatinine Ratio: 18 (calc) (ref 6–22)
BUN: 38 mg/dL — ABNORMAL HIGH (ref 7–25)
CO2: 22 mmol/L (ref 20–32)
Calcium: 9.5 mg/dL (ref 8.6–10.2)
Chloride: 105 mmol/L (ref 98–110)
Creat: 2.09 mg/dL — ABNORMAL HIGH (ref 0.50–0.99)
Globulin: 3.1 g/dL (calc) (ref 1.9–3.7)
Glucose, Bld: 79 mg/dL (ref 65–99)
Potassium: 4.7 mmol/L (ref 3.5–5.3)
Sodium: 138 mmol/L (ref 135–146)
Total Bilirubin: 0.5 mg/dL (ref 0.2–1.2)
Total Protein: 7.4 g/dL (ref 6.1–8.1)
eGFR: 29 mL/min/{1.73_m2} — ABNORMAL LOW (ref >=60)

## 2022-03-16 LAB — TSH: TSH: 2.92 mIU/L

## 2022-03-16 LAB — VITAMIN B12: Vitamin B-12: 337 pg/mL (ref 200–1100)

## 2022-03-16 LAB — MAGNESIUM: Magnesium: 2 mg/dL (ref 1.5–2.5)

## 2022-03-16 MED ORDER — CICLOPIROX 8 % EX SOLN: Freq: Every day | CUTANEOUS | 1 refills | Status: DC

## 2022-04-04 ENCOUNTER — Encounter: Payer: Self-pay | Admitting: Adult Health

## 2022-04-04 DIAGNOSIS — Z6831 Body mass index (BMI) 31.0-31.9, adult: Secondary | ICD-10-CM | POA: Diagnosis not present

## 2022-04-04 DIAGNOSIS — Z1231 Encounter for screening mammogram for malignant neoplasm of breast: Secondary | ICD-10-CM | POA: Diagnosis not present

## 2022-04-04 DIAGNOSIS — Z01419 Encounter for gynecological examination (general) (routine) without abnormal findings: Secondary | ICD-10-CM | POA: Diagnosis not present

## 2022-04-05 DIAGNOSIS — Z124 Encounter for screening for malignant neoplasm of cervix: Secondary | ICD-10-CM | POA: Diagnosis not present

## 2022-04-09 DIAGNOSIS — N184 Chronic kidney disease, stage 4 (severe): Secondary | ICD-10-CM | POA: Diagnosis not present

## 2022-04-09 DIAGNOSIS — H6983 Other specified disorders of Eustachian tube, bilateral: Secondary | ICD-10-CM | POA: Diagnosis not present

## 2022-04-09 DIAGNOSIS — J029 Acute pharyngitis, unspecified: Secondary | ICD-10-CM | POA: Diagnosis not present

## 2022-04-12 ENCOUNTER — Encounter: Payer: Self-pay | Admitting: Adult Health

## 2022-04-12 ENCOUNTER — Ambulatory Visit: Payer: BC Managed Care – PPO | Admitting: Adult Health

## 2022-04-12 VITALS — BP 110/80 | HR 94 | Temp 97.9°F | Wt 177.0 lb

## 2022-04-12 DIAGNOSIS — J029 Acute pharyngitis, unspecified: Secondary | ICD-10-CM | POA: Diagnosis not present

## 2022-04-12 MED ORDER — AMOXICILLIN-POT CLAVULANATE 875-125 MG PO TABS: 1.0000 | ORAL_TABLET | Freq: Two times a day (BID) | ORAL | 0 refills | Status: AC

## 2022-04-12 MED ORDER — DEXAMETHASONE 1 MG PO TABS: ORAL_TABLET | ORAL | 0 refills | Status: DC

## 2022-04-12 NOTE — Progress Notes (Signed)
Assessment and Plan: ? ?Gustava was seen today for acute visit. ? ?Diagnoses and all orders for this visit: ? ?Acute pharyngitis, unspecified etiology ?Pharyngitis: take medicationss as prescribed, increase fluids,  Salt water gargles. ?Most likely viral; fairly benign appearing throat on exam ?No improvement with anthistamine ?Discussed typical viral course ?Steroid taper offered ?Expect improvement within 2-3 days ?If not improving, persistent sx, or rebound sx start augmentin  ?-     dexamethasone (DECADRON) 1 MG tablet; Take 3 tabs for 2 days, 2 tabs for 2 days 1 tab for 3 days. Take with food in the morning. ? ?Other orders ?Fill and hold; won't have access while on cruise ?-     amoxicillin-clavulanate (AUGMENTIN) 875-125 MG tablet; Take 1 tablet by mouth 2 (two) times daily for 7 days. ? ?Further disposition pending results of labs. Discussed med's effects and SE's.   ?Over 15 minutes of exam, counseling, chart review, and critical decision making was performed.  ? ?Future Appointments  ?Date Time Provider [place]  ?03/19/2023  9:00 AM Liane Comber, NP [facility] None  ? ? ?------------------------------------------------------------------------------------------------------------------ ? ? ?HPI ?BP 110/80   Pulse 94   Temp 97.9 ?F (36.6 ?C)   Wt 177 lb (80.3 kg)   SpO2 99%   BMI 30.86 kg/m?  ?46 y.o.female presents for evaluation of day 4 of URI/sore throat.  ? ?Has had mild itchy throat for several week, Monday woke up with sore throat, redness, white patches, was seen at [facility] Monday AM, strep was negative. Was advised URI, Claritin, nasal spray (unsure what), not much improvement. Reports mild dry non-productive cough. Throat remains very sore.  ? ?She is leaving on cruise this Saturday, just wants to feel better.  ? ?Past Medical History:  ?Diagnosis Date  ? AMA (advanced maternal age) primigravida 39+   ? Hypertension   ? 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 OCD (obsessive  compulsive disorder)   ? Osteosarcoma ([place])   ? Renal insufficiency   ? Secondary to chemotherapy  ? Twin pregnancy, antepartum 08/12/2012  ? Di/Di    Start fetal testing @ 32wks   ?  ? ?No Known Allergies ? ?Current Outpatient Medications on File Prior to Visit  ?Medication Sig  ? amoxicillin (AMOXIL) 500 MG capsule Take      2 capsules   1 to 2 hours        prior to Dental Procedure  ? Cholecalciferol 100 MCG (4000 UT) CAPS Take 1 capsule by mouth daily.  ? citalopram (CELEXA) 40 MG tablet Take  1 tablet  Daily for Mood  ? Cyanocobalamin (B-12 DOTS SL) Place under the tongue daily.  ? enalapril (VASOTEC) 5 MG tablet Take  1 tablet  Daily  for BP & Kidney Protection                          /          TAKE 1 TABLET BY MOUTH DAILY FOR.....  ? norethindrone-ethinyl estradiol-iron (MICROGESTIN FE,GILDESS FE,LOESTRIN FE) 1.5-30 MG-MCG tablet Take 1 tablet by mouth daily.  ? ciclopirox (PENLAC) 8 % solution Apply topically at bedtime. Apply over nail and surrounding skin. Apply daily over previous coat. After seven (7) days, may remove with alcohol and continue cycle. (Patient not taking: Reported on 04/12/2022)  ? ?No current facility-administered medications on file prior to visit.  ? ? ?ROS: all negative except above.  ? ?Physical Exam: ? ?BP 110/80  Pulse 94   Temp 97.9 ?F (36.6 ?C)   Wt 177 lb (80.3 kg)   SpO2 99%   BMI 30.86 kg/m?  ? ?General Appearance: Well nourished, in no apparent distress. ?Eyes: PERRLA, conjunctiva no swelling or erythema ?Sinuses: No Frontal/maxillary tenderness ?ENT/Mouth: Ext aud canals clear, TMs without erythema, bulging. post pharynx mildly erythematous, without swelling or exudate.  Tonsils not swollen or erythematous. Hearing normal.  ?Neck: Supple, thyroid normal.  ?Respiratory: Respiratory effort normal, BS equal bilaterally without rales, rhonchi, wheezing or stridor.  ?Cardio: RRR with no MRGs. Brisk peripheral pulses without edema.  ?Lymphatics: Non tender without  lymphadenopathy.  ?Musculoskeletal: normal gait.  ?Skin: Warm, dry without rashes, lesions, ecchymosis.  ?Neuro:  Normal muscle tone ?Psych: Awake and oriented X 3, normal affect, Insight and Judgment appropriate.  ?  ? ?Izora Ribas, NP ?4:01 PM ?[facility] ? ?

## 2022-04-24 ENCOUNTER — Encounter: Payer: Self-pay | Admitting: Gastroenterology

## 2022-05-14 ENCOUNTER — Ambulatory Visit (AMBULATORY_SURGERY_CENTER): Payer: Self-pay | Admitting: *Deleted

## 2022-05-14 VITALS — Ht 63.5 in | Wt 176.2 lb

## 2022-05-14 DIAGNOSIS — Z1211 Encounter for screening for malignant neoplasm of colon: Secondary | ICD-10-CM

## 2022-05-14 MED ORDER — NA SULFATE-K SULFATE-MG SULF 17.5-3.13-1.6 GM/177ML PO SOLN: 1.0000 | Freq: Once | ORAL | 0 refills | Status: AC

## 2022-05-14 NOTE — Progress Notes (Signed)

## 2022-05-21 ENCOUNTER — Other Ambulatory Visit: Payer: Self-pay | Admitting: Internal Medicine

## 2022-05-25 ENCOUNTER — Encounter: Payer: Self-pay | Admitting: Gastroenterology

## 2022-05-30 ENCOUNTER — Encounter: Payer: Self-pay | Admitting: Gastroenterology

## 2022-05-30 ENCOUNTER — Ambulatory Visit (AMBULATORY_SURGERY_CENTER): Payer: BC Managed Care – PPO | Admitting: Gastroenterology

## 2022-05-30 VITALS — BP 129/42 | HR 106 | Temp 97.3°F | Resp 22 | Ht 63.5 in | Wt 176.2 lb

## 2022-05-30 DIAGNOSIS — Z1211 Encounter for screening for malignant neoplasm of colon: Secondary | ICD-10-CM

## 2022-05-30 DIAGNOSIS — D127 Benign neoplasm of rectosigmoid junction: Secondary | ICD-10-CM | POA: Diagnosis not present

## 2022-05-30 MED ORDER — SODIUM CHLORIDE 0.9 % IV SOLN: 500.0000 mL | Freq: Once | INTRAVENOUS | Status: DC

## 2022-05-30 NOTE — Progress Notes (Signed)
PT taken to PACU. Monitors in place. VSS. Report given to RN. 

## 2022-05-30 NOTE — Progress Notes (Signed)
Called to room to assist during endoscopic procedure.  Patient ID and intended procedure confirmed with present staff. Received instructions for my participation in the procedure from the performing physician.  

## 2022-05-30 NOTE — Op Note (Signed)
[place] Patient Name: Jackie Wilson Procedure Date: 05/30/2022 9:19 AM MRN: [id number] Endoscopist: Jackquline Denmark , MD Age: 46 Referring MD:  Date of Birth: [birth year] Gender: Female Account #: [id number] Procedure:                Colonoscopy Indications:              Screening for colorectal malignant neoplasm Medicines:                Monitored Anesthesia Care Procedure:                Pre-Anesthesia Assessment:                           - Prior to the procedure, a History and Physical                            was performed, and patient medications and                            allergies were reviewed. The patient's tolerance of                            previous anesthesia was also reviewed. The risks                            and benefits of the procedure and the sedation                            options and risks were discussed with the patient.                            All questions were answered, and informed consent                            was obtained. Prior Anticoagulants: The patient has                            taken no previous anticoagulant or antiplatelet                            agents. ASA Grade Assessment: II - A patient with                            mild systemic disease. After reviewing the risks                            and benefits, the patient was deemed in                            satisfactory condition to undergo the procedure.                           After obtaining informed consent, the colonoscope  was passed under direct vision. Throughout the                            procedure, the patient's blood pressure, pulse, and                            oxygen saturations were monitored continuously. The                            Olympus PCF-H190DL (#[id number]) Colonoscope was                            introduced through the anus and advanced to the 2                            cm into the ileum. The  colonoscopy was performed                            without difficulty. The patient tolerated the                            procedure well. The quality of the bowel                            preparation was good. The terminal ileum, ileocecal                            valve, appendiceal orifice, and rectum were                            photographed. Scope In: 9:40:09 AM Scope Out: 9:53:07 AM Scope Withdrawal Time: 0 hours 8 minutes 21 seconds  Total Procedure Duration: 0 hours 12 minutes 58 seconds  Findings:                 A 10 mm polyp was found in the recto-sigmoid colon,                            15 cm from the anal verge. The polyp was                            semi-pedunculated. The polyp was removed with a hot                            snare. Resection and retrieval were complete.                            Photodocumentation of post polypectomy site was                            also obtained.                           Non-bleeding internal hemorrhoids were found during  retroflexion. The hemorrhoids were very minimal.                           The terminal ileum appeared normal.                           The exam was otherwise without abnormality on                            direct and retroflexion views. Complications:            No immediate complications. Estimated Blood Loss:     Estimated blood loss: none. Impression:               - One 10 mm polyp at the recto-sigmoid colon,                            removed with a hot snare. Resected and retrieved.                           - The examined portion of the ileum was normal.                           - The examination was otherwise normal on direct                            and retroflexion views. Recommendation:           - Patient has a contact number available for                            emergencies. The signs and symptoms of potential                            delayed  complications were discussed with the                            patient. Return to normal activities tomorrow.                            Written discharge instructions were provided to the                            patient.                           - Resume previous diet.                           - Continue present medications.                           - Await pathology results.                           - Repeat colonoscopy for surveillance based on  pathology results.                           - The findings and recommendations were discussed                            with the patient's family. Jackquline Denmark, MD 05/30/2022 9:59:26 AM This report has been signed electronically.

## 2022-05-30 NOTE — Patient Instructions (Signed)
   Handouts on polyps & hemorrhoids given to you  Await pathology results on polyp removed     YOU HAD AN ENDOSCOPIC PROCEDURE TODAY AT [facility]:   Refer to the procedure report that was given to you for any specific questions about what was found during the examination.  If the procedure report does not answer your questions, please call your gastroenterologist to clarify.  If you requested that your care partner not be given the details of your procedure findings, then the procedure report has been included in a sealed envelope for you to review at your convenience later.  YOU SHOULD EXPECT: Some feelings of bloating in the abdomen. Passage of more gas than usual.  Walking can help get rid of the air that was put into your GI tract during the procedure and reduce the bloating. If you had a lower endoscopy (such as a colonoscopy or flexible sigmoidoscopy) you may notice spotting of blood in your stool or on the toilet paper. If you underwent a bowel prep for your procedure, you may not have a normal bowel movement for a few days.  Please Note:  You might notice some irritation and congestion in your nose or some drainage.  This is from the oxygen used during your procedure.  There is no need for concern and it should clear up in a day or so.  SYMPTOMS TO REPORT IMMEDIATELY:  Following lower endoscopy (colonoscopy or flexible sigmoidoscopy):  Excessive amounts of blood in the stool  Significant tenderness or worsening of abdominal pains  Swelling of the abdomen that is new, acute  Fever of 100F or higher   For urgent or emergent issues, a gastroenterologist can be reached at any hour by calling [phone]. Do not use MyChart messaging for urgent concerns.    DIET:  We do recommend a small meal at first, but then you may proceed to your regular diet.  Drink plenty of fluids but you should avoid alcoholic beverages for 24 hours.  ACTIVITY:  You should plan to take  it easy for the rest of today and you should NOT DRIVE or use heavy machinery until tomorrow (because of the sedation medicines used during the test).    FOLLOW UP: Our staff will call the number listed on your records the next business day following your procedure.  We will call around 7:15- 8:00 am to check on you and address any questions or concerns that you may have regarding the information given to you following your procedure. If we do not reach you, we will leave a message.  If you develop any symptoms (ie: fever, flu-like symptoms, shortness of breath, cough etc.) before then, please call [phone].  If you test positive for Covid 19 in the 2 weeks post procedure, please call and report this information to [place].    If any biopsies were taken you will be contacted by phone or by letter within the next 1-3 weeks.  Please call us at [phone] if you have not heard about the biopsies in 3 weeks.    SIGNATURES/CONFIDENTIALITY: You and/or your care partner have signed paperwork which will be entered into your electronic medical record.  These signatures attest to the fact that that the information above on your After Visit Summary has been reviewed and is understood.  Full responsibility of the confidentiality of this discharge information lies with you and/or your care-partner.

## 2022-05-30 NOTE — Progress Notes (Signed)
[facility] History and Physical   Primary Care Physician:  Unk Pinto, MD   Reason for Procedure:   [facility] screeing   Plan:    colon     HPI: Jackie Wilson is a 46 y.o. female   No nausea, vomiting, heartburn, regurgitation, odynophagia or dysphagia.  No significant diarrhea or constipation.  No melena or hematochezia. No unintentional weight loss. No abdominal pain.  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[place])    Renal insufficiency    Secondary to chemotherapy   Twin pregnancy, antepartum 08/12/2012   Di/Di    Start fetal testing @ 32wks     Past Surgical History:  Procedure Laterality Date   CESAREAN SECTION  09/08/2012   Procedure: CESAREAN SECTION;  Surgeon: Luz Lex, MD;  Location: [facility];  Service: Obstetrics;  Laterality: N/A;   LEEP     PATELLA FRACTURE SURGERY Right 12/2019   At [facility]   Right leg replacement     after tumor removal, from thigh to shin   TUMOR REMOVAL Right 2011   Right leg, hardware removal with cement implantation   WISDOM TOOTH EXTRACTION      Prior to Admission medications   Medication Sig Start Date End Date Taking? Authorizing Provider  Cholecalciferol 100 MCG (4000 UT) CAPS Take 1 capsule by mouth daily.   Yes [provider]  citalopram (CELEXA) 40 MG tablet TAKE ONE TABLET BY MOUTH DAILY FOR MOOD 05/21/22  Yes Alycia Rossetti, NP  Cyanocobalamin (B-12 DOTS SL) Place under the tongue daily.   Yes [provider]  enalapril (VASOTEC) 5 MG tablet Take  1 tablet  Daily  for BP & Kidney Protection                          /          TAKE 1 TABLET BY MOUTH DAILY FOR..... 09/15/21  Yes Unk Pinto, MD  norethindrone-ethinyl estradiol-iron (MICROGESTIN FE,GILDESS FE,LOESTRIN FE)  1.5-30 MG-MCG tablet Take 1 tablet by mouth daily.   Yes [provider]  amoxicillin (AMOXIL) 500 MG capsule Take      2 capsules   1 to 2 hours        prior to Dental Procedure Patient not taking: Reported on 05/14/2022 09/21/20   Unk Pinto, MD    Current Outpatient Medications  Medication Sig Dispense Refill   Cholecalciferol 100 MCG (4000 UT) CAPS Take 1 capsule by mouth daily.     citalopram (CELEXA) 40 MG tablet TAKE ONE TABLET BY MOUTH DAILY FOR MOOD 90 tablet 3   Cyanocobalamin (B-12 DOTS SL) Place under the tongue daily.     enalapril (VASOTEC) 5 MG tablet Take  1 tablet  Daily  for BP & Kidney Protection                          /          TAKE 1 TABLET BY MOUTH DAILY FOR..... 90 tablet 3   norethindrone-ethinyl estradiol-iron (MICROGESTIN FE,GILDESS FE,LOESTRIN FE) 1.5-30 MG-MCG tablet Take 1 tablet by mouth daily.     amoxicillin (AMOXIL) 500 MG capsule Take      2 capsules  1 to 2 hours        prior to Dental Procedure (Patient not taking: Reported on 05/14/2022) 30 capsule 0   Current Facility-Administered Medications  Medication Dose Route Frequency Provider Last Rate Last Admin   0.9 %  sodium chloride infusion  500 mL Intravenous Once Jackquline Denmark, MD        Allergies as of 05/30/2022   (No Known Allergies)    Family History  Problem Relation Age of Onset   Diabetes Father    Hypertension Father    Hypertension Maternal Grandfather    Hyperlipidemia Maternal Grandfather    Lung cancer Paternal Grandmother        smoker   Colon cancer Paternal Great-grandmother        72s   Other Neg Hx    Crohn's disease Neg Hx    Esophageal cancer Neg Hx    Rectal cancer Neg Hx    Stomach cancer Neg Hx     Social History   Socioeconomic History   Marital status: Married    Spouse name: Not on file   Number of children: Not on file   Years of education: Not on file   Highest education level: Not on file  Occupational History   Not on file  Tobacco Use    Smoking status: Former    Packs/day: 0.50    Years: 6.00    Total pack years: 3.00    Types: Cigarettes    Quit date: 12/03/1996    Years since quitting: 25.5    Passive exposure: Never   Smokeless tobacco: Never  Vaping Use   Vaping Use: Never used  Substance and Sexual Activity   Alcohol use: Yes    Alcohol/week: 2.0 standard drinks of alcohol    Types: 2 Standard drinks or equivalent per week   Drug use: Not Currently    Types: Marijuana    Comment: remote as a teen    Sexual activity: Yes    Partners: Male    Birth control/protection: Pill, OCP  Other Topics Concern   Not on file  Social History Narrative   Not on file   Social Determinants of Health   Financial Resource Strain: Not on file  Food Insecurity: Not on file  Transportation Needs: Not on file  Physical Activity: Not on file  Stress: Not on file  Social Connections: Not on file  Intimate Partner Violence: Not on file    Review of Systems: Positive for none All other review of systems negative except as mentioned in the HPI.  Physical Exam: Vital signs in last 24 hours: '@VSRANGES'$ @   General:   Alert,  Well-developed, well-nourished, pleasant and cooperative in NAD Lungs:  Clear throughout to auscultation.   Heart:  Regular rate and rhythm; no murmurs, clicks, rubs,  or gallops. Abdomen:  Soft, nontender and nondistended. Normal bowel sounds.   Neuro/Psych:  Alert and cooperative. Normal mood and affect. A and O x 3    No significant changes were identified.  The patient continues to be an appropriate candidate for the planned procedure and anesthesia.   Carmell Austria, MD. [facility] 05/30/2022 9:30 AM@

## 2022-05-31 ENCOUNTER — Telehealth: Payer: Self-pay | Admitting: *Deleted

## 2022-05-31 NOTE — Telephone Encounter (Signed)
Attempt for follow up phone call. No answer at number given.  Left message on voicemail.   

## 2022-06-01 ENCOUNTER — Encounter: Payer: Self-pay | Admitting: Gastroenterology

## 2022-07-10 DIAGNOSIS — C499 Malignant neoplasm of connective and soft tissue, unspecified: Secondary | ICD-10-CM | POA: Diagnosis not present

## 2022-07-10 DIAGNOSIS — X58XXXD Exposure to other specified factors, subsequent encounter: Secondary | ICD-10-CM | POA: Diagnosis not present

## 2022-07-10 DIAGNOSIS — S82031D Displaced transverse fracture of right patella, subsequent encounter for closed fracture with routine healing: Secondary | ICD-10-CM | POA: Diagnosis not present

## 2022-07-10 DIAGNOSIS — C4021 Malignant neoplasm of long bones of right lower limb: Secondary | ICD-10-CM | POA: Diagnosis not present

## 2022-07-10 DIAGNOSIS — W1839XD Other fall on same level, subsequent encounter: Secondary | ICD-10-CM | POA: Diagnosis not present

## 2022-07-12 DIAGNOSIS — Z111 Encounter for screening for respiratory tuberculosis: Secondary | ICD-10-CM | POA: Diagnosis not present

## 2022-07-14 DIAGNOSIS — Z681 Body mass index (BMI) 19 or less, adult: Secondary | ICD-10-CM | POA: Diagnosis not present

## 2022-07-14 DIAGNOSIS — Z111 Encounter for screening for respiratory tuberculosis: Secondary | ICD-10-CM | POA: Diagnosis not present

## 2022-10-01 ENCOUNTER — Other Ambulatory Visit: Payer: Self-pay | Admitting: Internal Medicine

## 2022-10-01 DIAGNOSIS — I150 Renovascular hypertension: Secondary | ICD-10-CM

## 2022-12-05 ENCOUNTER — Encounter: Payer: Self-pay | Admitting: Internal Medicine

## 2022-12-11 ENCOUNTER — Encounter: Payer: Self-pay | Admitting: Nurse Practitioner

## 2022-12-14 ENCOUNTER — Encounter: Payer: Self-pay | Admitting: Nurse Practitioner

## 2022-12-14 ENCOUNTER — Ambulatory Visit: Payer: BC Managed Care – PPO | Admitting: Nurse Practitioner

## 2022-12-14 VITALS — BP 100/60 | HR 77 | Temp 97.9°F | Ht 63.5 in | Wt 181.4 lb

## 2022-12-14 DIAGNOSIS — T7840XA Allergy, unspecified, initial encounter: Secondary | ICD-10-CM

## 2022-12-14 DIAGNOSIS — L309 Dermatitis, unspecified: Secondary | ICD-10-CM

## 2022-12-14 DIAGNOSIS — Z85830 Personal history of malignant neoplasm of bone: Secondary | ICD-10-CM

## 2022-12-14 DIAGNOSIS — L299 Pruritus, unspecified: Secondary | ICD-10-CM | POA: Diagnosis not present

## 2022-12-14 DIAGNOSIS — R21 Rash and other nonspecific skin eruption: Secondary | ICD-10-CM | POA: Diagnosis not present

## 2022-12-14 MED ORDER — HYDROXYZINE HCL 10 MG PO TABS: 10.0000 mg | ORAL_TABLET | Freq: Three times a day (TID) | ORAL | 0 refills | Status: DC | PRN

## 2022-12-14 NOTE — Patient Instructions (Signed)
Allergies, Adult An allergy means that your body reacts to something that bothers it (allergen). This can happen from something that you eat, breathe in, or touch. Allergies often affect the nose, eyes, skin, and stomach. They can be mild, moderate, or very bad (severe). An allergy cannot spread from person to person. They can happen at any age. Sometimes, people outgrow them. What are the causes? Outdoor things, such as pollen, car fumes, and mold. Indoor things, such as dust, smoke, mold, and pets. Foods. Medicines. Things that bother your skin, such as perfume and bug bites. What increases the risk? Having family members with allergies or asthma. What are the signs or symptoms? Symptoms depend on how bad your allergy is. Mild to moderate symptoms Runny nose, stuffy nose, or sneezing. Itchy mouth, ears, or throat. A feeling of mucus dripping down the back of your throat. Sore throat. Eyes that are itchy, red, watery, or puffy. A skin rash, or red, swollen areas of skin (hives). Stomach cramps or bloating. Severe symptoms Very bad allergies to food, medicine, or bug bites may cause a very bad allergy reaction (anaphylaxis). This can be life-threatening. Symptoms include: A red face. Wheezing or coughing. Swollen lips, tongue, or mouth. Tight or swollen throat. Chest pain or tightness, or a fast heartbeat. Trouble breathing or shortness of breath. Pain in your belly (abdomen), vomiting, or watery poop (diarrhea). Feeling dizzy or fainting. How is this treated?     Treatment for this condition depends on your symptoms. Treatment may include: Cold, wet cloths for itching and swelling. Eye drops, nose sprays, or skin creams. Washing out your nose each day. A humidifier. Medicines. A change to the foods you eat. Being exposed again and again to tiny amounts of allergens. This helps your body get used to them. You might have: Allergy shots. Very small amounts of allergen put  under your tongue. An emergency shot (auto-injector pen) if you have a very bad allergy reaction. This is a medicine with a needle. You can put it into your skin by yourself. Your doctor will teach you how to use it. Follow these instructions at home: Medicines  Take or apply over-the-counter and prescription medicines only as told by your doctor. If you are at risk for a very bad allergy reaction, keep an auto-injector pen with you all the time. Eating and drinking Follow instructions from your doctor about what to eat and drink. Drink enough fluid to keep your pee (urine) pale yellow. General instructions If you have ever had a very bad allergy reaction, wear a medical alert bracelet or necklace. Stay away from things that you are allergic to. Keep all follow-up visits as told by your doctor. This is important. Contact a doctor if: Your symptoms do not get better with treatment. Get help right away if: You have symptoms of a very bad allergy reaction. These include: A swollen mouth, tongue, or throat. Pain or tightness in your chest. Trouble breathing. Being short of breath. Dizziness. Fainting. Very bad pain in your belly. Vomiting. Watery poop. These symptoms may be an emergency. Do not wait to see if the symptoms will go away. Get medical help right away. Call your local emergency services (911 in the [place] Do not drive yourself to the hospital. Summary Take or apply over-the-counter and prescription medicines only as told by your doctor. Stay away from things you are allergic to. If you are at risk for a very bad allergy reaction, carry an auto-injector pen all the time.  Wear a medical alert bracelet or necklace. Very bad allergy reactions can be life-threatening. Get help right away. This information is not intended to replace advice given to you by your health care provider. Make sure you discuss any questions you have with your health care provider. Document Revised:  09/30/2019 Document Reviewed: 09/30/2019 Elsevier Patient Education  [place].

## 2022-12-14 NOTE — Progress Notes (Signed)
Assessment and Plan:  Keslee Harrington was seen today for an episodic visit.  1. Allergic reaction, initial encounter Possible contact dermatitis/allergic reaction versus pruritus d/t CKD. Will send updated labs to nephrology Check CBC/CMP/GFR   2. Rash and nonspecific skin eruption Continue to monitor for spreading of rash. Try not to scratch to reduce any increase in infection Monitor triggers  3. Pruritus Apply topical OTC Hydrocortisone Start Hydroxyzine   4. Eczema of both hands Keep hand free of moisture, and have them remain dry Apply emollient cream daily. Continue to monitor  5. History of osteosarcoma Continue to monitor  Orders Placed This Encounter  Procedures   CBC with Differential/Platelet   COMPLETE METABOLIC PANEL WITH GFR   Meds ordered this encounter  Medications   hydrOXYzine (ATARAX) 10 MG tablet    Sig: Take 1 tablet (10 mg total) by mouth 3 (three) times daily as needed.    Dispense:  30 tablet    Refill:  0    Order Specific Question:   Supervising Provider    Answer:   Unk Pinto [phone]   Notify office for further evaluation and treatment, questions or concerns if s/s fail to improve. The risks and benefits of my recommendations, as well as other treatment options were discussed with the patient today. Questions were answered.  Further disposition pending results of labs. Discussed med's effects and SE's.    Over 20 minutes of exam, counseling, chart review, and critical decision making was performed.   Future Appointments  Date Time Provider [place]  03/19/2023  9:00 AM Shaquon Gropp, Kenney Houseman, NP [facility] None    ------------------------------------------------------------------------------------------------------------------   HPI BP 100/60   Pulse 77   Temp 97.9 F (36.6 C)   Ht 5' 3.5" (1.613 m)   Wt 181 lb 6.4 oz (82.3 kg)   SpO2 99%   BMI 31.63 kg/m   47 y.o.female presents for evaluation of itching and rash.   States that the feeling has been present for several years.  Intermittent.  She has noticed an increase in pruritus over the last few weeks.  She has been applying Hydrocortisone 1% with some improvement.  She has not seen a dermatologist.  She is unfamiliar with any trigger although reports sister has a dog and she has had two cats in her home "forever."  She denies any new medications, soaps, lotions, creams.  She does have a of CKD IV and follows with nephrology.  She denies any other associated symptoms, fever, chills, N/V, chills, abd pain.    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   citalopram (CELEXA) 40 MG tablet TAKE ONE TABLET BY MOUTH DAILY FOR MOOD   Cyanocobalamin (B-12 DOTS SL) Place under the tongue daily.   enalapril (VASOTEC) 5 MG tablet TAKE ONE TABLET BY MOUTH DAILY  FOR BLOOD PRESSURE AND KIDNEY PROTECTION   hydrocortisone 2.5 % cream Apply topically 2 (two) times daily.  norethindrone-ethinyl estradiol-iron (MICROGESTIN FE,GILDESS FE,LOESTRIN FE) 1.5-30 MG-MCG tablet Take 1 tablet by mouth daily.   amoxicillin (AMOXIL) 500 MG capsule Take      2 capsules   1 to 2 hours        prior to Dental Procedure (Patient not taking: Reported on 05/14/2022)   No current facility-administered medications on file prior to visit.    ROS: all negative except what is noted in the HPI.   Physical Exam:  BP 100/60   Pulse 77   Temp 97.9 F (36.6 C)   Ht 5' 3.5"  (1.613 m)   Wt 181 lb 6.4 oz (82.3 kg)   SpO2 99%   BMI 31.63 kg/m   General Appearance: NAD.  Awake, conversant and cooperative. Eyes: PERRLA, EOMs intact.  Sclera white.  Conjunctiva without erythema. Sinuses: No frontal/maxillary tenderness.  No nasal discharge. Nares patent.  ENT/Mouth: Ext aud canals clear.  Bilateral TMs w/DOL and without erythema or bulging. Hearing intact.  Posterior pharynx without swelling or exudate.  Tonsils without swelling or erythema.  Neck: Supple.  No masses, nodules or thyromegaly. Respiratory: Effort is regular with non-labored breathing. Breath sounds are equal bilaterally without rales, rhonchi, wheezing or stridor.  Cardio: RRR with no MRGs. Brisk peripheral pulses without edema.  Abdomen: Active BS in all four quadrants.  Soft and non-tender without guarding, rebound tenderness, hernias or masses. Lymphatics: Non tender without lymphadenopathy.  Musculoskeletal: Full ROM, 5/5 strength, normal ambulation.  No clubbing or cyanosis. Skin: BUE with dry skin associated with moderate erythema.  Appropriate color for ethnicity. Warm. Neuro: CN II-XII grossly normal. Normal muscle tone without cerebellar symptoms and intact sensation.   Psych: AO X 3,  appropriate mood and affect, insight and judgment.     Darrol Jump, NP 11:43 AM [facility]

## 2022-12-15 LAB — CBC WITH DIFFERENTIAL/PLATELET
Absolute Monocytes: 378 cells/uL (ref 200–950)
Basophils Absolute: 22 cells/uL (ref 0–200)
Basophils Relative: 0.4 %
Eosinophils Absolute: 49 cells/uL (ref 15–500)
Eosinophils Relative: 0.9 %
HCT: 37 % (ref 35.0–45.0)
Hemoglobin: 12.5 g/dL (ref 11.7–15.5)
Lymphs Abs: 1172 cells/uL (ref 850–3900)
MCH: 28.7 pg (ref 27.0–33.0)
MCHC: 33.8 g/dL (ref 32.0–36.0)
MCV: 84.9 fL (ref 80.0–100.0)
MPV: 11.6 fL (ref 7.5–12.5)
Monocytes Relative: 7 %
Neutro Abs: 3780 cells/uL (ref 1500–7800)
Neutrophils Relative %: 70 %
Platelets: 215 10*3/uL (ref 140–400)
RBC: 4.36 10*6/uL (ref 3.80–5.10)
RDW: 12.1 % (ref 11.0–15.0)
Total Lymphocyte: 21.7 %
WBC: 5.4 10*3/uL (ref 3.8–10.8)

## 2022-12-15 LAB — COMPLETE METABOLIC PANEL WITH GFR
AG Ratio: 1.4 (calc) (ref 1.0–2.5)
ALT: 15 U/L (ref 6–29)
AST: 14 U/L (ref 10–35)
Albumin: 4 g/dL (ref 3.6–5.1)
Alkaline phosphatase (APISO): 60 U/L (ref 31–125)
BUN/Creatinine Ratio: 18 (calc) (ref 6–22)
BUN: 38 mg/dL — ABNORMAL HIGH (ref 7–25)
CO2: 17 mmol/L — ABNORMAL LOW (ref 20–32)
Calcium: 9.2 mg/dL (ref 8.6–10.2)
Chloride: 109 mmol/L (ref 98–110)
Creat: 2.16 mg/dL — ABNORMAL HIGH (ref 0.50–0.99)
Globulin: 2.9 g/dL (calc) (ref 1.9–3.7)
Glucose, Bld: 79 mg/dL (ref 65–99)
Potassium: 5.2 mmol/L (ref 3.5–5.3)
Sodium: 138 mmol/L (ref 135–146)
Total Bilirubin: 0.5 mg/dL (ref 0.2–1.2)
Total Protein: 6.9 g/dL (ref 6.1–8.1)
eGFR: 28 mL/min/{1.73_m2} — ABNORMAL LOW (ref >=60)

## 2022-12-17 ENCOUNTER — Other Ambulatory Visit: Payer: BC Managed Care – PPO

## 2022-12-17 ENCOUNTER — Other Ambulatory Visit: Payer: Self-pay

## 2022-12-17 DIAGNOSIS — Z79899 Other long term (current) drug therapy: Secondary | ICD-10-CM

## 2022-12-18 ENCOUNTER — Encounter: Payer: Self-pay | Admitting: Nurse Practitioner

## 2022-12-18 LAB — COMPLETE METABOLIC PANEL WITH GFR
AG Ratio: 1.3 (calc) (ref 1.0–2.5)
ALT: 14 U/L (ref 6–29)
AST: 15 U/L (ref 10–35)
Albumin: 3.9 g/dL (ref 3.6–5.1)
Alkaline phosphatase (APISO): 57 U/L (ref 31–125)
BUN/Creatinine Ratio: 16 (calc) (ref 6–22)
BUN: 35 mg/dL — ABNORMAL HIGH (ref 7–25)
CO2: 17 mmol/L — ABNORMAL LOW (ref 20–32)
Calcium: 8.5 mg/dL — ABNORMAL LOW (ref 8.6–10.2)
Chloride: 106 mmol/L (ref 98–110)
Creat: 2.17 mg/dL — ABNORMAL HIGH (ref 0.50–0.99)
Globulin: 3 g/dL (calc) (ref 1.9–3.7)
Glucose, Bld: 79 mg/dL (ref 65–99)
Potassium: 4.3 mmol/L (ref 3.5–5.3)
Sodium: 136 mmol/L (ref 135–146)
Total Bilirubin: 0.4 mg/dL (ref 0.2–1.2)
Total Protein: 6.9 g/dL (ref 6.1–8.1)
eGFR: 28 mL/min/{1.73_m2} — ABNORMAL LOW (ref >=60)

## 2023-01-16 ENCOUNTER — Other Ambulatory Visit: Payer: Self-pay

## 2023-01-16 ENCOUNTER — Ambulatory Visit (INDEPENDENT_AMBULATORY_CARE_PROVIDER_SITE_OTHER): Payer: BC Managed Care – PPO | Admitting: Nurse Practitioner

## 2023-01-16 ENCOUNTER — Encounter: Payer: Self-pay | Admitting: Nurse Practitioner

## 2023-01-16 VITALS — BP 96/60 | HR 71 | Temp 97.9°F | Ht 63.5 in | Wt 182.0 lb

## 2023-01-16 DIAGNOSIS — R6889 Other general symptoms and signs: Secondary | ICD-10-CM

## 2023-01-16 DIAGNOSIS — N184 Chronic kidney disease, stage 4 (severe): Secondary | ICD-10-CM

## 2023-01-16 DIAGNOSIS — L299 Pruritus, unspecified: Secondary | ICD-10-CM

## 2023-01-16 DIAGNOSIS — B354 Tinea corporis: Secondary | ICD-10-CM

## 2023-01-16 DIAGNOSIS — Z1152 Encounter for screening for COVID-19: Secondary | ICD-10-CM

## 2023-01-16 DIAGNOSIS — Z79899 Other long term (current) drug therapy: Secondary | ICD-10-CM

## 2023-01-16 LAB — POC COVID19 BINAXNOW: SARS Coronavirus 2 Ag: NEGATIVE

## 2023-01-16 LAB — POCT INFLUENZA A/B
Influenza A, POC: NEGATIVE
Influenza B, POC: NEGATIVE

## 2023-01-16 MED ORDER — CLOTRIMAZOLE-BETAMETHASONE 1-0.05 % EX CREA: TOPICAL_CREAM | CUTANEOUS | 1 refills | Status: AC

## 2023-01-16 NOTE — Progress Notes (Signed)
FOLLOW UP  Assessment and Plan:   Flu-like symptoms Negative  - POCT Influenza A/B  Encounter for screening for COVID-19 Negative  - POC COVID-19  Ringworm of body Keep area free of moisture Apply Lotrisone cream as directed. RTC if no improvement in 2 weeks.  - clotrimazole-betamethasone (LOTRISONE) cream; Apply to affected area 2 times daily  Dispense: 15 g; Refill: 1  CKD (chronic kidney disease) stage 4, GFR 15-29 ml/min (HCC)/Pruritus Continue to follow up with Nephrology 02/01/23 Continue Hydroxyzine as needed. Discussed how what you eat and drink can aide in kidney protection. Stay well hydrated. Avoid high salt foods. Avoid NSAIDS. Keep BP and BG well controlled.   Take medications as prescribed. Remain active and exercise as tolerated daily. Maintain weight.  Continue to monitor. Check CMP/GFR/Microablumin   Medication management All medications discussed and reviewed in full. All questions and concerns regarding medications addressed.     Notify office for further evaluation and treatment, questions or concerns if any reported s/s fail to improve.   The patient was advised to call back or seek an in-person evaluation if any symptoms worsen or if the condition fails to improve as anticipated.   Further disposition pending results of labs. Discussed med's effects and SE's.    I discussed the assessment and treatment plan with the patient. The patient was provided an opportunity to ask questions and all were answered. The patient agreed with the plan and demonstrated an understanding of the instructions.  Discussed med's effects and SE's. Screening labs and tests as requested with regular follow-up as recommended.  I provided 20 minutes of face-to-face time during this encounter including counseling, chart review, and critical decision making was preformed.  Future Appointments  Date Time Provider [place]  03/19/2023  9:00 AM Darrol Jump, NP  [facility] None    ----------------------------------------------------------------------------------------------------------------------  HPI 47 y.o. female  presents for follow up skin rash and pruritus.    She has a f/u with Nephrology, Dr. Posey Pronto scheduled for 02/01/23.  Continues to have pruritus but better managed with Hydroxyzine, not as many flares. She does not follow a kidney friendly diet.  Lab Results  Component Value Date   EGFR 28 (L) 12/17/2022   Her blood pressure has been controlled at home, today their BP is  96/60.  She is continuing Enalapril.   She denies chest pain, shortness of breath, dizziness  She is concerned for a type of ring worm rash on her RLE.  Onset 2 weeks ago.  She was using topical Neosporin without relief.  She switched to Lotrimin OTC with some improvement.    She also complains of symptoms of a URI. Symptoms include congestion and headache described as throbbing.  States that her family has been sick.  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 Onset of symptoms was 1 week ago, and has been unchanged since that time. Treatment to date: none.  Current Medications:  Current Outpatient Medications on File Prior to Visit  Medication Sig   amoxicillin (AMOXIL) 500 MG capsule Take      2 capsules   1 to 2 hours        prior to Dental Procedure (Patient not taking: Reported on 05/14/2022)   Cholecalciferol 100 MCG (4000 UT) CAPS Take 1 capsule by mouth daily.   citalopram (CELEXA) 40 MG tablet TAKE ONE TABLET BY MOUTH DAILY FOR MOOD   Cyanocobalamin (B-12 DOTS SL) Place under the tongue daily.  enalapril (VASOTEC) 5 MG tablet TAKE ONE TABLET BY MOUTH DAILY  FOR BLOOD PRESSURE AND KIDNEY PROTECTION   hydrocortisone 2.5 % cream Apply topically 2 (two) times daily.   hydrOXYzine (ATARAX) 10 MG tablet Take 1 tablet (10 mg total) by mouth 3 (three) times daily as needed.   norethindrone-ethinyl estradiol-iron (MICROGESTIN  FE,GILDESS FE,LOESTRIN FE) 1.5-30 MG-MCG tablet Take 1 tablet by mouth daily.   No current facility-administered medications on file prior to visit.   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 The remainder of the ROS are negative.   Physical Exam: BP 96/60   Pulse 71   Temp 97.9 F (36.6 C)   Ht 5' 3.5" (1.613 m)   Wt 182 lb (82.6 kg)   SpO2 99%   BMI 31.73 kg/m   General Appearance: Well nourished, in no apparent distress. Eyes: PERRLA, EOMs, conjunctiva no swelling or erythema Sinuses: No Frontal/maxillary tenderness ENT/Mouth: Ext aud canals clear, TMs without erythema, bulging. No erythema, swelling, or exudate on post pharynx.  Tonsils not swollen or erythematous. Hearing normal.  Neck: Supple, thyroid normal.  Respiratory: Respiratory effort normal, BS equal bilaterally without rales, rhonchi, wheezing or stridor.  Cardio: RRR with no MRGs. Brisk peripheral pulses without edema.  Abdomen: Soft, + BS.  Non tender, no guarding, rebound, hernias, masses. Lymphatics: Non tender without lymphadenopathy.  Musculoskeletal: Full ROM, 5/5 strength, Normal gait Skin: RLE shin with approximately 3 cm round raised exterior lesion that is erythematous, light pink flat center.  Warm, dry.   Psych: Awake and oriented X 3, normal affect, Insight and Judgment appropriate.    Darrol Jump, NP 12:12  PM [facility]

## 2023-01-16 NOTE — Patient Instructions (Addendum)
Foods to eat: Low potassium foods such as apples, cabbage, carrots, green beans Low protein foods such as vegetables, fruits, breads and cereals Use lemon, herbs, spices to flavor your meals  Foods to avoid: High potassium foods such as bananas, potatoes, spinach, oranges High protein foods such as lean meat, eggs, milk, cheese, beans High phosphorus foods such as cheese, ice cream, chocolate Avoid foods that are liquid at room temperature such as gelatin dessert and fluids  Body Ringworm Body ringworm is an infection of the skin that often causes a ring-shaped rash. Body ringworm is also called tinea corporis. Body ringworm can affect any part of your skin. This condition is easily spread from person to person (is very contagious). What are the causes? This condition is caused by fungi called dermatophytes. The condition develops when these fungi grow out of control on the skin. You can get this condition if you touch a person or animal that has it. You can also get it if you share any items with an infected person or pet. These include: Clothing, bedding, and towels. Brushes or combs. Gym equipment. Any other object that has the fungus on it. What increases the risk? You are more likely to develop this condition if you: Play sports that involve close physical contact, such as wrestling. Sweat a lot. Live in areas that are hot and humid. Use public showers. Have a weakened disease-fighting system (immune system). What are the signs or symptoms? Symptoms of this condition include: Itchy, raised red spots and bumps. Red scaly patches. A ring-shaped rash. The rash may have: A clear center. Scales or red bumps at its center. Redness near its borders. Dry and scaly skin on or around it. How is this diagnosed? This condition can usually be diagnosed with a skin exam. A skin scraping may be taken from the affected area and examined under a microscope to see if the fungus is  present. How is this treated? This condition may be treated with: An antifungal cream or ointment. An antifungal shampoo. Antifungal medicines. These may be prescribed if your ringworm: Is severe. Keeps coming back or lasts a long time. Follow these instructions at home: Take over-the-counter and prescription medicines only as told by your health care provider. If you were given an antifungal cream or ointment: Use it as told by your health care provider. Wash the infected area and dry it completely before applying the cream or ointment. If you were given an antifungal shampoo: Use it as told by your health care provider. Leave the shampoo on your body for 3-5 minutes before rinsing. While you have a rash: Wear loose clothing to stop clothes from rubbing and irritating it. Wash or change your bed sheets every night. Wash clothes and bed sheets in hot water. Disinfect or throw out items that may be infected. Wash your hands often with soap and water for at least 20 seconds. If soap and water are not available, use hand sanitizer. If your pet has the same infection, take your pet to see a veterinarian for treatment. How is this prevented? Take a bath or shower every day and after every time you work out or play sports. Dry your skin completely after bathing. Wear sandals or shoes in public places and showers. Wash athletic clothes after each use. Do not share personal items with others. Avoid touching red patches of skin on other people. Avoid touching pets that have bald spots. If you touch an animal that has a bald spot, wash  your hands. Contact a health care provider if: Your rash continues to spread after 7 days of treatment. Your rash is not gone in 4 weeks. The area around your rash gets red, warm, tender, and swollen. This information is not intended to replace advice given to you by your health care provider. Make sure you discuss any questions you have with your health care  provider. Document Revised: 05/03/2022 Document Reviewed: 05/03/2022 Elsevier Patient Education  [employer].

## 2023-01-28 DIAGNOSIS — N184 Chronic kidney disease, stage 4 (severe): Secondary | ICD-10-CM | POA: Diagnosis not present

## 2023-02-01 DIAGNOSIS — N184 Chronic kidney disease, stage 4 (severe): Secondary | ICD-10-CM | POA: Diagnosis not present

## 2023-02-01 DIAGNOSIS — I129 Hypertensive chronic kidney disease with stage 1 through stage 4 chronic kidney disease, or unspecified chronic kidney disease: Secondary | ICD-10-CM | POA: Diagnosis not present

## 2023-02-01 DIAGNOSIS — N2581 Secondary hyperparathyroidism of renal origin: Secondary | ICD-10-CM | POA: Diagnosis not present

## 2023-02-01 DIAGNOSIS — D631 Anemia in chronic kidney disease: Secondary | ICD-10-CM | POA: Diagnosis not present

## 2023-02-01 DIAGNOSIS — R809 Proteinuria, unspecified: Secondary | ICD-10-CM | POA: Diagnosis not present

## 2023-02-27 DIAGNOSIS — N184 Chronic kidney disease, stage 4 (severe): Secondary | ICD-10-CM | POA: Diagnosis not present

## 2023-03-19 ENCOUNTER — Ambulatory Visit (INDEPENDENT_AMBULATORY_CARE_PROVIDER_SITE_OTHER): Payer: BC Managed Care – PPO | Admitting: Nurse Practitioner

## 2023-03-19 ENCOUNTER — Encounter: Payer: Self-pay | Admitting: Nurse Practitioner

## 2023-03-19 VITALS — BP 118/80 | HR 75 | Temp 97.6°F | Resp 16 | Ht 63.5 in | Wt 184.2 lb

## 2023-03-19 DIAGNOSIS — I150 Renovascular hypertension: Secondary | ICD-10-CM | POA: Diagnosis not present

## 2023-03-19 DIAGNOSIS — Z6832 Body mass index (BMI) 32.0-32.9, adult: Secondary | ICD-10-CM

## 2023-03-19 DIAGNOSIS — Z1389 Encounter for screening for other disorder: Secondary | ICD-10-CM | POA: Diagnosis not present

## 2023-03-19 DIAGNOSIS — E785 Hyperlipidemia, unspecified: Secondary | ICD-10-CM

## 2023-03-19 DIAGNOSIS — Z136 Encounter for screening for cardiovascular disorders: Secondary | ICD-10-CM

## 2023-03-19 DIAGNOSIS — Z0000 Encounter for general adult medical examination without abnormal findings: Secondary | ICD-10-CM

## 2023-03-19 DIAGNOSIS — Z0001 Encounter for general adult medical examination with abnormal findings: Secondary | ICD-10-CM

## 2023-03-19 DIAGNOSIS — E559 Vitamin D deficiency, unspecified: Secondary | ICD-10-CM

## 2023-03-19 DIAGNOSIS — Z79899 Other long term (current) drug therapy: Secondary | ICD-10-CM | POA: Diagnosis not present

## 2023-03-19 DIAGNOSIS — Z131 Encounter for screening for diabetes mellitus: Secondary | ICD-10-CM | POA: Diagnosis not present

## 2023-03-19 DIAGNOSIS — N184 Chronic kidney disease, stage 4 (severe): Secondary | ICD-10-CM

## 2023-03-19 DIAGNOSIS — L299 Pruritus, unspecified: Secondary | ICD-10-CM

## 2023-03-19 DIAGNOSIS — Z85830 Personal history of malignant neoplasm of bone: Secondary | ICD-10-CM

## 2023-03-19 DIAGNOSIS — Z13220 Encounter for screening for lipoid disorders: Secondary | ICD-10-CM | POA: Diagnosis not present

## 2023-03-19 DIAGNOSIS — B351 Tinea unguium: Secondary | ICD-10-CM

## 2023-03-19 DIAGNOSIS — R7309 Other abnormal glucose: Secondary | ICD-10-CM

## 2023-03-19 DIAGNOSIS — E669 Obesity, unspecified: Secondary | ICD-10-CM

## 2023-03-19 DIAGNOSIS — E66811 Obesity, class 1: Secondary | ICD-10-CM

## 2023-03-19 MED ORDER — PHENTERMINE HCL 37.5 MG PO TABS
ORAL_TABLET | ORAL | 0 refills | Status: DC
Start: 2023-03-19 — End: 2024-04-01

## 2023-03-19 NOTE — Progress Notes (Signed)
Complete Physical  Assessment and Plan:  Encounter for Annual Physical Exam with abnormal findings Due annually  Health Maintenance reviewed Healthy lifestyle reviewed and goals set  Renovascular hypertension Nephrology following Continue Enalapril Discussed DASH (Dietary Approaches to Stop Hypertension) DASH diet is lower in sodium than a typical American diet. Cut back on foods that are high in saturated fat, cholesterol, and trans fats. Eat more whole-grain foods, fish, poultry, and nuts Remain active and exercise as tolerated daily.  Monitor BP at home-Call if greater than 130/80.  Check CMP/CBC  Chronic kidney disease (CKD), stage 4 [facility]) Nephrology following Discussed how what you eat and drink can aide in kidney protection. Stay well hydrated. Avoid high salt foods. Avoid NSAIDS. Keep BP and BG well controlled.   Take medications as prescribed. Remain active and exercise as tolerated daily. Maintain weight.  Continue to monitor. Check CMP/GFR/Microablumin  Hyperlipidemia, unspecified hyperlipidemia type Discussed lifestyle modifications. Recommended diet heavy in fruits and veggies, omega 3's. Decrease consumption of animal meats, cheeses, and dairy products. Remain active and exercise as tolerated. Continue to monitor. Check lipids/TSH  Abnormal glucose Education: Reviewed 'ABCs' of diabetes management  Discussed goals to be met and/or maintained include A1C (<7) Blood pressure (<130/80) Cholesterol (LDL <70) Continue Eye Exam yearly  Continue Dental Exam Q6 mo Discussed dietary recommendations Discussed Physical Activity recommendations Check A1C  Medication management All medications discussed and reviewed in full. All questions and concerns regarding medications addressed.    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 Physical activity. Encouraged/praised to build confidence.  Onychomycosis Resolved  Pruritus  Continue follow up with Dermatology 04/2023 Monitor GFR   Orders Placed This Encounter  Procedures   CBC with Differential/Platelet   COMPLETE METABOLIC PANEL WITH GFR   Lipid panel   TSH   Hemoglobin A1c   Insulin, random   VITAMIN D 25 Hydroxy (Vit-D Deficiency, Fractures)   Magnesium   Urinalysis, Routine w reflex microscopic   Microalbumin / creatinine urine ratio   EKG 12-Lead    Notify office for further evaluation and treatment, questions or concerns if any reported s/s fail to improve.   The patient was advised to call back or seek an in-person evaluation if any symptoms worsen or if the condition fails to improve as anticipated.   Further disposition pending results of labs. Discussed med's effects and SE's.    I discussed the assessment and treatment plan with the patient. The patient was provided an opportunity to ask questions and all were answered. The patient agreed with the plan and demonstrated an understanding of the instructions.  Discussed med's effects and SE's. Screening labs and tests as requested with regular follow-up as recommended.  I provided 35 minutes of face-to-face time during this encounter including counseling, chart review, and critical decision making was preformed.  Today's Plan of Care is based on a patient-centered health care approach known as shared decision making - the decisions, tests and treatments allow for patient preferences and values to be balanced with clinical evidence.     Future Appointments  Date Time Provider Department Center  04/03/2023  3:30 PM Terri Piedra, MD [facility] None  03/18/2024  9:00 AM Adela Glimpse, NP [facility] None     HPI  47 y.o. female  presents for a complete physical and follow up for has  History of osteosarcoma; Hypertension;  Hyperlipidemia; Abnormal glucose; Medication management; Vitamin D deficiency; Obesity (BMI 30.0-34.9); Eczema of both hands; Bilateral sensorineural hearing loss; and CKD (chronic kidney disease) stage 4, GFR 15-29 ml/min on their problem list.  She is married, she has fraternal twins, doing well. 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   She has had increase in generalized pruritus thought to be secondary to CKD however when last seen Nephrology they did not feel as though it was r/t CKD.  She was treated with a low dose steroid taper which helped but once stopped pruritus returned.  She has a consultation with Dermatology scheduled for 04/2023.  She does have Hydroxyzine and Hydrocortisone on hand but this does not help.   She follows with Dr. Rana Snare at [facility], goes annually. 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 Getting annual mammograms there. Has one scheduled for 04/2023.  She had history of osteosarcoma s/p chem in 2000, follows with [facility] q2y. Chemotherapy resulted in CKD IIIb, proteinuria resolved on ACEi, BP controlled. Had normal PTH. Progressed to stage 4 and now re-established with [facility] and follows every 6 mo.  She is on celexa for OCD/Anxiety and reports medication effectiveness. Reports stable mood.  Denies extreme mood swings, HI/SI.  BMI is Body mass index is 32.12 kg/m., she has been working on diet and exercise. She walks on a treadmill daily for 30 minutes.   Wt Readings from Last 3 Encounters:  03/19/23 184 lb 3.2 oz (83.6 kg)  01/16/23 182 lb (82.6 kg)  12/14/22 181 lb 6.4 oz (82.3 kg)   Her blood pressure has been controlled at home, on enalapril 5 mg daily, today their BP is BP: 118/80  She does workout,   She denies chest pain, shortness of breath, dizziness.  She is not on cholesterol medication and denies myalgias. Her cholesterol is not at goal. The cholesterol last visit was:   Lab Results  Component Value Date    CHOL 203 (H) 03/15/2022   HDL 55 03/15/2022   LDLCALC 120 (H) 03/15/2022   TRIG 160 (H) 03/15/2022   CHOLHDL 3.7 03/15/2022    Last A1C in the office was:  Lab Results  Component Value Date   HGBA1C 5.0 03/15/2022   CKD 4 (chemotherapy), now following with [facility] Dr. Allena Katz. Has follow up planned May 2023. Last GFR: Lab Results  Component Value Date   EGFR 28 (L) 12/17/2022   EGFR 28 (L) 12/14/2022   EGFR 29 (L) 03/15/2022   Patient is on Vitamin D supplement, 4000 IU daily  Lab Results  Component Value Date   VD25OH 72 03/15/2022     Lab Results  Component Value Date   VITAMINB12 337 03/15/2022     Current Medications:  Current Outpatient Medications on File Prior to Visit  Medication Sig Dispense Refill   Cholecalciferol 100 MCG (4000 UT) CAPS Take 1 capsule by mouth daily.     citalopram (CELEXA) 40 MG tablet TAKE ONE TABLET BY MOUTH DAILY FOR MOOD 90 tablet 3   Cyanocobalamin (B-12 DOTS SL) Place under the tongue daily.     enalapril (VASOTEC) 5 MG tablet TAKE ONE TABLET BY MOUTH DAILY  FOR BLOOD PRESSURE AND KIDNEY PROTECTION 90 tablet 3   hydrocortisone 2.5 % cream Apply topically 2 (two) times daily.     hydrOXYzine (ATARAX) 10 MG tablet Take 1 tablet (10 mg total) by mouth 3 (three) times daily as  needed. 30 tablet 0   norethindrone-ethinyl estradiol-iron (MICROGESTIN FE,GILDESS FE,LOESTRIN FE) 1.5-30 MG-MCG tablet Take 1 tablet by mouth daily.     amoxicillin (AMOXIL) 500 MG capsule Take      2 capsules   1 to 2 hours        prior to Dental Procedure (Patient not taking: Reported on 05/14/2022) 30 capsule 0   No current facility-administered medications on file prior to visit.   Allergies:  No Known Allergies   Medical History:  She has History of osteosarcoma; Hypertension; Hyperlipidemia; Abnormal glucose; Medication management; Vitamin D deficiency; Obesity (BMI 30.0-34.9); Eczema of both hands; Bilateral sensorineural hearing loss; and CKD  (chronic kidney disease) stage 4, GFR 15-29 ml/min on their problem list.   Health Maintenance:   Immunization History  Administered Date(s) Administered   Influenza, Quadrivalent, Recombinant, Inj, Pf 09/27/2019   Influenza-Unspecified 10/06/2017, 10/28/2021   PFIZER Comirnaty(Gray Top)Covid-19 Tri-Sucrose Vaccine 11/20/2021   PFIZER(Purple Top)SARS-COV-2 Vaccination 02/16/2020, 03/08/2020, 10/18/2020, 01/30/2021   PPD Test 11/24/2013, 11/29/2014, 12/12/2015, 07/12/2022   Pneumococcal Polysaccharide-23 11/24/2013   Td 03/15/2022   Tdap 07/11/2012   Health Maintenance  Topic Date Due   COVID-19 Vaccine (6 - 2023-24 season) 08/03/2022   INFLUENZA VACCINE  07/04/2023   PAP SMEAR-Modifier  02/08/2024   COLONOSCOPY (Pts 45-18yrs Insurance coverage will need to be confirmed)  05/30/2025   DTaP/Tdap/Td (3 - Td or Tdap) 03/15/2032   Hepatitis C Screening  Completed   HIV Screening  Completed   HPV VACCINES  Aged Out   Tetanus: 07/2012 - patient preference to boost today, Td  No LMP recorded. 03/16/23 Pap: 02/07/2022 Dr. Rana Snare, has upcoming  MGM: 02/07/2022 at Dr. Rana Snare, has upcoming  DEXA: 11/2020, normal  - GYN orders.    Colonoscopy: 05/2022 - Recall 3 years.  EGD: N/A  Last Dental Exam: goes q89m, next 04/2022 Last Eye Exam: Dr. Anner Crete, 2022, no changes in vision, just has readers  Patient Care Team: Lucky Cowboy, MD as PCP - General (Internal Medicine) Christia Reading, MD as Consulting Physician (Otolaryngology)  Surgical History:  She has a past surgical history that includes Right leg replacement; LEEP; Tumor removal (Right, 2011); Cesarean section (09/08/2012); Patella fracture surgery (Right, 12/2019); and Wisdom tooth extraction. Family History:  Herfamily history includes Colon cancer in her paternal great-grandmother; Diabetes in her father; Hyperlipidemia in her maternal grandfather; Hypertension in her father and maternal grandfather; Lung cancer in her paternal  grandmother. Social History:  She reports that she quit smoking about 26 years ago. Her smoking use included cigarettes. She has a 3.00 pack-year smoking history. She has never been exposed to tobacco smoke. She has never used smokeless tobacco. She reports current alcohol use of about 2.0 standard drinks of alcohol per week. She reports that she does not currently use drugs after having used the following drugs: Marijuana.   Review of Systems: Review of Systems  Constitutional: Negative.  Negative for malaise/fatigue and weight loss.  HENT:  Positive for hearing loss and tinnitus. Negative for ear discharge, ear pain, nosebleeds, sinus pain and sore throat.   Eyes: Negative.  Negative for blurred vision and double vision.  Respiratory: Negative.  Negative for cough, shortness of breath, wheezing and stridor.   Cardiovascular: Negative.  Negative for chest pain, palpitations, orthopnea, claudication and leg swelling.  Gastrointestinal: Negative.  Negative for abdominal pain, blood in stool, constipation, diarrhea, heartburn, melena, nausea and vomiting.  Genitourinary: Negative.   Musculoskeletal:  Negative for back pain, falls, joint pain,  myalgias and neck pain.  Skin:  Negative for itching and rash.  Neurological: Negative.  Negative for dizziness, tingling, sensory change, weakness and headaches.  Endo/Heme/Allergies: Negative.  Negative for polydipsia.  Psychiatric/Behavioral: Negative.    All other systems reviewed and are negative.   Physical Exam: Estimated body mass index is 32.12 kg/m as calculated from the following:   Height as of this encounter: 5' 3.5" (1.613 m).   Weight as of this encounter: 184 lb 3.2 oz (83.6 kg). BP 118/80   Pulse 75   Temp 97.6 F (36.4 C)   Resp 16   Ht 5' 3.5" (1.613 m)   Wt 184 lb 3.2 oz (83.6 kg)   SpO2 99%   BMI 32.12 kg/m  General Appearance: Well nourished, in no apparent distress.  Eyes: PERRLA, EOMs, conjunctiva no swelling or  erythema Sinuses: No Frontal/maxillary tenderness  ENT/Mouth: Ext aud canals clear, normal light reflex with TMs without erythema, bulging. Good dentition. No erythema, swelling, or exudate on post pharynx. Tonsils not swollen or erythematous. HOH with bil hearing aids.  Neck: Supple, thyroid normal. No bruits  Respiratory: Respiratory effort normal, BS equal bilaterally without rales, rhonchi, wheezing or stridor.  Cardio: RRR without murmurs, rubs or gallops. Brisk peripheral pulses without edema.  Chest: symmetric, with normal excursions and percussion.  Breasts: defer to GYN Abdomen: Soft, nontender, no guarding, rebound, hernias, masses, or organomegaly.  Lymphatics: Non tender without lymphadenopathy.  Genitourinary: defer to GYN Musculoskeletal: Full ROM all peripheral extremities, 5/5 strength, and normal gait.  Skin: Warm, dry without rashes, lesions, ecchymosis. R great toenail with separation of medial nail with yellowing.  Neuro: Cranial nerves intact, reflexes equal bilaterally. Normal muscle tone, no cerebellar symptoms. Sensation intact.  Psych: Awake and oriented X 3, normal affect, Insight and Judgment appropriate.   EKG: NSR, NSCPT  Priyanka Causey, DNP, AGNP-C 9:20 AM [facility]

## 2023-03-19 NOTE — Patient Instructions (Signed)
Phentermine Capsules or Tablets What is this medication? PHENTERMINE (FEN ter meen) promotes weight loss. It works by decreasing appetite. It is often used for a short period of time. Changes to diet and exercise are often combined with this medication. This medicine may be used for other purposes; ask your health care provider or pharmacist if you have questions. COMMON BRAND NAME(S): Adipex-P, Atti-Plex P, Atti-Plex P Spansule, Fastin, Lomaira, Pro-Fast, Pro-Fast HS, Pro-Fast SA, Tara-8 What should I tell my care team before I take this medication? They need to know if you have any of these conditions: Agitation or nervousness Diabetes Glaucoma Heart disease High blood pressure History of substance use disorder History of stroke Kidney disease Lung disease called Primary Pulmonary Hypertension (PPH) Taken an MAOI, such as Carbex, Eldepryl, Marplan, Nardil, or Parnate in last 14 days Taking stimulant medications for attention disorders, weight loss, or to stay awake Thyroid disease An unusual or allergic reaction to phentermine, other medications, foods, dyes, or preservatives Pregnant or trying to get pregnant Breastfeeding How should I use this medication? Take this medication by mouth with a glass of water. Follow the directions on the prescription label. Take your medication at regular intervals. Do not take it more often than directed. Do not stop taking except on your care team's advice. Talk to your care team about the use of this medication in children. While this medication may be prescribed for children 17 years or older for selected conditions, precautions do apply. Overdosage: If you think you have taken too much of this medicine contact a poison control center or emergency room at once. NOTE: This medicine is only for you. Do not share this medicine with others. What if I miss a dose? If you miss a dose, take it as soon as you can. If it is almost time for your next dose,  take only that dose. Do not take double or extra doses. What may interact with this medication? Do not take this medication with any of the following: MAOIs, such as Carbex, Eldepryl, Marplan, Nardil, and Parnate This medication may also interact with the following: Alcohol Certain medications for depression, anxiety, or other mental health conditions Certain medications for blood pressure Linezolid Medications for colds or breathing difficulties, such as pseudoephedrine or phenylephrine Medications for diabetes Sibutramine Stimulant medications for ADHD, weight loss, or staying awake This list may not describe all possible interactions. Give your health care provider a list of all the medicines, herbs, non-prescription drugs, or dietary supplements you use. Also tell them if you smoke, drink alcohol, or use illegal drugs. Some items may interact with your medicine. What should I watch for while using this medication? Visit your care team for regular checks on your progress. Do not stop taking except on your care team's advice. You may develop a severe reaction. Your care team will tell you how much medication to take. Do not take this medication close to bedtime. It may prevent you from sleeping. This medication may affect your coordination, reaction time, or judgment. Do not drive or operate machinery until you know how this medication affects you. Sit up or stand slowly to reduce the risk of dizzy or fainting spells. Drinking alcohol with this medication can increase the risk of these side effects. This medication may affect blood sugar levels. Ask your care team if changes in diet or medications are needed if you have diabetes. Inform your care team if you wish to become pregnant or think you might be pregnant. Losing   weight while pregnant is not advised and may cause harm to the unborn child. Talk to your care team for more information. What side effects may I notice from receiving this  medication? Side effects that you should report to your care team as soon as possible: Allergic reactions--skin rash, itching, hives, swelling of the face, lips, tongue, or throat Heart valve disease--shortness of breath, chest pain, unusual weakness or fatigue, dizziness, feeling faint or lightheaded, fever, sudden weight gain, fast or irregular heartbeat Pulmonary hypertension--shortness of breath, chest pain, fast or irregular heartbeat, feeling faint or lightheaded, fatigue, swelling of the ankles or feet Side effects that usually do not require medical attention (report to your care team if they continue or are bothersome): Change in taste Diarrhea Dizziness Dry mouth Restlessness Trouble sleeping This list may not describe all possible side effects. Call your doctor for medical advice about side effects. You may report side effects to FDA at 1-800-FDA-1088. Where should I keep my medication? Keep out of the reach of children. This medication can be abused. Keep your medication in a safe place to protect it from theft. Do not share this medication with anyone. Selling or giving away this medication is dangerous and against the law. This medication may cause harm and death if it is taken by other adults, children, or pets. Return medication that has not been used to an official disposal site. Contact the DEA at 1-800-882-9539 or your city/county government to find a site. If you cannot return the medication, mix any unused medication with a substance like cat litter or coffee grounds. Then throw the medication away in a sealed container like a sealed bag or coffee can with a lid. Do not use the medication after the expiration date. Store at room temperature between 20 and 25 degrees C (68 and 77 degrees F). Keep container tightly closed. NOTE: This sheet is a summary. It may not cover all possible information. If you have questions about this medicine, talk to your doctor, pharmacist, or health  care provider.  2023 Elsevier/Gold Standard (2008-01-10 00:00:00)  

## 2023-03-20 LAB — HEMOGLOBIN A1C
Hgb A1c MFr Bld: 5.2 % of total Hgb (ref <5.7)
Mean Plasma Glucose: 103 mg/dL
eAG (mmol/L): 5.7 mmol/L

## 2023-03-20 LAB — COMPLETE METABOLIC PANEL WITH GFR
AG Ratio: 1.4 (calc) (ref 1.0–2.5)
ALT: 24 U/L (ref 6–29)
AST: 19 U/L (ref 10–35)
Albumin: 4.2 g/dL (ref 3.6–5.1)
Alkaline phosphatase (APISO): 76 U/L (ref 31–125)
BUN/Creatinine Ratio: 15 (calc) (ref 6–22)
BUN: 34 mg/dL — ABNORMAL HIGH (ref 7–25)
CO2: 26 mmol/L (ref 20–32)
Calcium: 9.5 mg/dL (ref 8.6–10.2)
Chloride: 103 mmol/L (ref 98–110)
Creat: 2.21 mg/dL — ABNORMAL HIGH (ref 0.50–0.99)
Globulin: 2.9 g/dL (calc) (ref 1.9–3.7)
Glucose, Bld: 79 mg/dL (ref 65–99)
Potassium: 5 mmol/L (ref 3.5–5.3)
Sodium: 136 mmol/L (ref 135–146)
Total Bilirubin: 0.6 mg/dL (ref 0.2–1.2)
Total Protein: 7.1 g/dL (ref 6.1–8.1)
eGFR: 27 mL/min/{1.73_m2} — ABNORMAL LOW (ref >=60)

## 2023-03-20 LAB — LIPID PANEL
Cholesterol: 207 mg/dL — ABNORMAL HIGH (ref <200)
HDL: 51 mg/dL (ref >=50)
LDL Cholesterol (Calc): 129 mg/dL (calc) — ABNORMAL HIGH
Non-HDL Cholesterol (Calc): 156 mg/dL (calc) — ABNORMAL HIGH (ref <130)
Total CHOL/HDL Ratio: 4.1 (calc) (ref <5.0)
Triglycerides: 155 mg/dL — ABNORMAL HIGH (ref <150)

## 2023-03-20 LAB — URINALYSIS, ROUTINE W REFLEX MICROSCOPIC
Bilirubin Urine: NEGATIVE
Glucose, UA: NEGATIVE
Hgb urine dipstick: NEGATIVE
Ketones, ur: NEGATIVE
Leukocytes,Ua: NEGATIVE
Nitrite: NEGATIVE
Protein, ur: NEGATIVE
Specific Gravity, Urine: 1.006 (ref 1.001–1.035)
pH: 6.5 (ref 5.0–8.0)

## 2023-03-20 LAB — CBC WITH DIFFERENTIAL/PLATELET
Absolute Monocytes: 456 cells/uL (ref 200–950)
Basophils Absolute: 29 cells/uL (ref 0–200)
Basophils Relative: 0.6 %
Eosinophils Absolute: 78 cells/uL (ref 15–500)
Eosinophils Relative: 1.6 %
HCT: 39.4 % (ref 35.0–45.0)
Hemoglobin: 13.1 g/dL (ref 11.7–15.5)
Lymphs Abs: 1142 cells/uL (ref 850–3900)
MCH: 28.5 pg (ref 27.0–33.0)
MCHC: 33.2 g/dL (ref 32.0–36.0)
MCV: 85.8 fL (ref 80.0–100.0)
MPV: 11.6 fL (ref 7.5–12.5)
Monocytes Relative: 9.3 %
Neutro Abs: 3195 cells/uL (ref 1500–7800)
Neutrophils Relative %: 65.2 %
Platelets: 225 10*3/uL (ref 140–400)
RBC: 4.59 10*6/uL (ref 3.80–5.10)
RDW: 12.6 % (ref 11.0–15.0)
Total Lymphocyte: 23.3 %
WBC: 4.9 10*3/uL (ref 3.8–10.8)

## 2023-03-20 LAB — MICROALBUMIN / CREATININE URINE RATIO
Creatinine, Urine: 28 mg/dL (ref 20–275)
Microalb Creat Ratio: 7 mg/g creat (ref <30)
Microalb, Ur: 0.2 mg/dL

## 2023-03-20 LAB — INSULIN, RANDOM: Insulin: 8.1 u[IU]/mL

## 2023-03-20 LAB — MAGNESIUM: Magnesium: 2.1 mg/dL (ref 1.5–2.5)

## 2023-03-20 LAB — VITAMIN D 25 HYDROXY (VIT D DEFICIENCY, FRACTURES): Vit D, 25-Hydroxy: 72 ng/mL (ref 30–100)

## 2023-03-20 LAB — TSH: TSH: 2.36 mIU/L

## 2023-04-03 ENCOUNTER — Ambulatory Visit: Payer: BC Managed Care – PPO | Admitting: Dermatology

## 2023-04-03 ENCOUNTER — Encounter: Payer: Self-pay | Admitting: Dermatology

## 2023-04-03 DIAGNOSIS — L2089 Other atopic dermatitis: Secondary | ICD-10-CM

## 2023-04-03 MED ORDER — CLOBETASOL PROPIONATE 0.05 % EX OINT: 1.0000 | TOPICAL_OINTMENT | Freq: Two times a day (BID) | CUTANEOUS | 0 refills | Status: DC

## 2023-04-03 MED ORDER — TACROLIMUS 0.1 % EX OINT: TOPICAL_OINTMENT | Freq: Two times a day (BID) | CUTANEOUS | 0 refills | Status: DC

## 2023-04-03 NOTE — Patient Instructions (Signed)
Due to recent changes in healthcare laws, you may see results of your pathology and/or laboratory studies on MyChart before the doctors have had a chance to review them. We understand that in some cases there may be results that are confusing or concerning to you. Please understand that not all results are received at the same time and often the doctors may need to interpret multiple results in order to provide you with the best plan of care or course of treatment. Therefore, we ask that you please give us 2 business days to thoroughly review all your results before contacting the office for clarification. Should we see a critical lab result, you will be contacted sooner.   If You Need Anything After Your Visit  If you have any questions or concerns for your doctor, please call our main line at [phone] If no one answers, please leave a voicemail as directed and we will return your call as soon as possible. Messages left after 4 pm will be answered the following business day.   You may also send us a message via MyChart. We typically respond to MyChart messages within 1-2 business days.  For prescription refills, please ask your pharmacy to contact our office. Our fax number is [phone].  If you have an urgent issue when the clinic is closed that cannot wait until the next business day, you can page your doctor at the number below.    Please note that while we do our best to be available for urgent issues outside of office hours, we are not available 24/7.   If you have an urgent issue and are unable to reach us, you may choose to seek medical care at your doctor's office, retail clinic, urgent care center, or emergency room.  If you have a medical emergency, please immediately call 911 or go to the emergency department. In the event of inclement weather, please call our main line at [phone] for an update on the status of any delays or closures.  Dermatology Medication Tips: Please  keep the boxes that topical medications come in in order to help keep track of the instructions about where and how to use these. Pharmacies typically print the medication instructions only on the boxes and not directly on the medication tubes.   If your medication is too expensive, please contact our office at [phone] or send us a message through MyChart.   We are unable to tell what your co-pay for medications will be in advance as this is different depending on your insurance coverage. However, we may be able to find a substitute medication at lower cost or fill out paperwork to get insurance to cover a needed medication.   If a prior authorization is required to get your medication covered by your insurance company, please allow us 1-2 business days to complete this process.  Drug prices often vary depending on where the prescription is filled and some pharmacies may offer cheaper prices.  The website www.goodrx.com contains coupons for medications through different pharmacies. The prices here do not account for what the cost may be with help from insurance (it may be cheaper with your insurance), but the website can give you the price if you did not use any insurance.  - You can print the associated coupon and take it with your prescription to the pharmacy.  - You may also stop by our office during regular business hours and pick up a GoodRx coupon card.  - If you need your   prescription sent electronically to a different pharmacy, notify our office through [facility] MyChart or by phone at [phone]     

## 2023-04-03 NOTE — Progress Notes (Unsigned)
   New Patient Visit   Subjective  Jackie Wilson is a 47 y.o. female who presents for the following: Rash  Has been present since December. Mostly located on the arms and legs, trunk, and chest. It has been treated by PCP gave hydrocortisone cream and kidney doctor gave a five day course of prednisone and did a blood test. The treatment did help some but never fully healed the rash. No new detergents, lotions, body soaps.   The following portions of the chart were reviewed this encounter and updated as appropriate: medications, allergies, medical history  Review of Systems:  No other skin or systemic complaints except as noted in HPI or Assessment and Plan.  Objective  Well appearing patient in no apparent distress; mood and affect are within normal limits.  A focused examination was performed of the following areas: Arms and legs  Relevant exam findings are noted in the Assessment and Plan.    Assessment & Plan   ATOPIC DERMATITIS Exam: Scaly pink papules coalescing to plaques  Flared  Atopic dermatitis (eczema) is a chronic, relapsing, pruritic condition that can significantly affect quality of life. It is often associated with allergic rhinitis and/or asthma and can require treatment with topical medications, phototherapy, or in severe cases biologic injectable medication (Dupixent; Adbry) or Oral JAK inhibitors.  Treatment Plan: -Clobetasol cream to use twice a day for two weeks. Alternate with Tacrolimus to use twice a day for two weeks  Recommend gentle skin care.    Return in about 2 months (around 06/03/2023) for Rash follow up.    Documentation: I have reviewed the above documentation for accuracy and completeness, and I agree with the above.  Langston Reusing, MD  I, Germaine Pomfret, CMA, am acting as scribe for Langston Reusing, MD.

## 2023-04-04 ENCOUNTER — Encounter: Payer: Self-pay | Admitting: Dermatology

## 2023-05-16 DIAGNOSIS — Z1231 Encounter for screening mammogram for malignant neoplasm of breast: Secondary | ICD-10-CM | POA: Diagnosis not present

## 2023-05-16 DIAGNOSIS — Z01419 Encounter for gynecological examination (general) (routine) without abnormal findings: Secondary | ICD-10-CM | POA: Diagnosis not present

## 2023-05-16 DIAGNOSIS — Z124 Encounter for screening for malignant neoplasm of cervix: Secondary | ICD-10-CM | POA: Diagnosis not present

## 2023-05-16 DIAGNOSIS — Z6829 Body mass index (BMI) 29.0-29.9, adult: Secondary | ICD-10-CM | POA: Diagnosis not present

## 2023-05-16 LAB — HM MAMMOGRAPHY

## 2023-05-19 ENCOUNTER — Encounter: Payer: Self-pay | Admitting: Nurse Practitioner

## 2023-05-19 DIAGNOSIS — Z6832 Body mass index (BMI) 32.0-32.9, adult: Secondary | ICD-10-CM

## 2023-05-19 DIAGNOSIS — Z136 Encounter for screening for cardiovascular disorders: Secondary | ICD-10-CM

## 2023-05-19 DIAGNOSIS — E785 Hyperlipidemia, unspecified: Secondary | ICD-10-CM

## 2023-05-20 LAB — HM PAP SMEAR

## 2023-05-27 ENCOUNTER — Other Ambulatory Visit: Payer: Self-pay

## 2023-05-27 MED ORDER — CITALOPRAM HYDROBROMIDE 40 MG PO TABS: ORAL_TABLET | ORAL | 3 refills | Status: DC

## 2023-06-13 IMAGING — US US RENAL
1 series · 14 of 25 positions shown · non-contrast
Comparison: None.

CLINICAL DATA: Chronic kidney disease stage 4

EXAM:
RENAL / URINARY TRACT ULTRASOUND COMPLETE

[Series 1: us renal · 0.22mm/px · 14 of 32 slices shown]
[im 1/32]
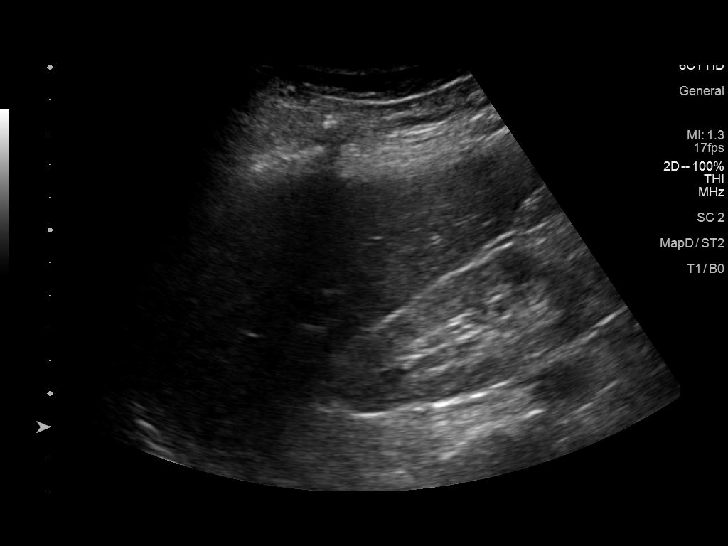
[im 3/32]
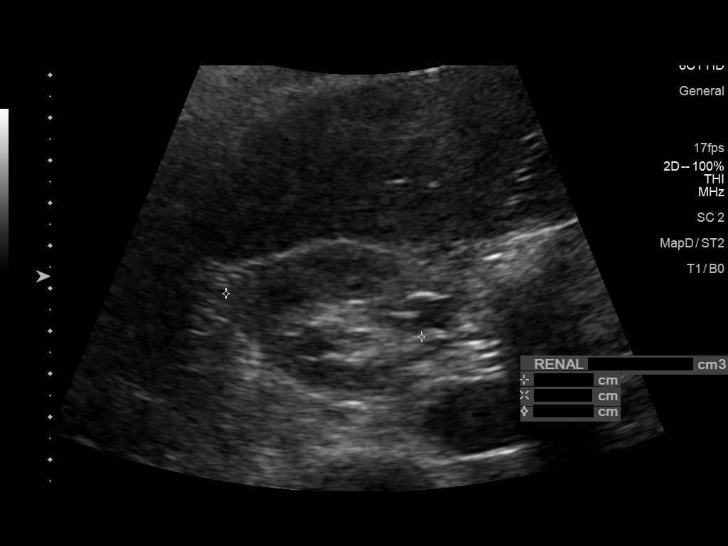
[im 6/32]
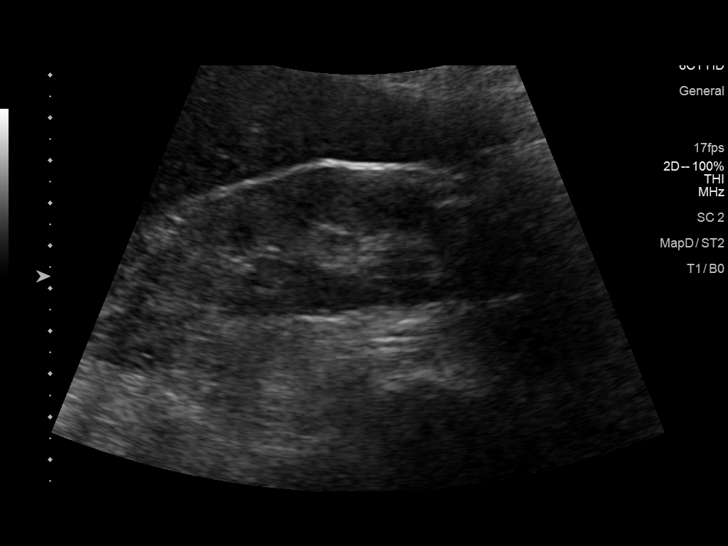
[im 8/32]
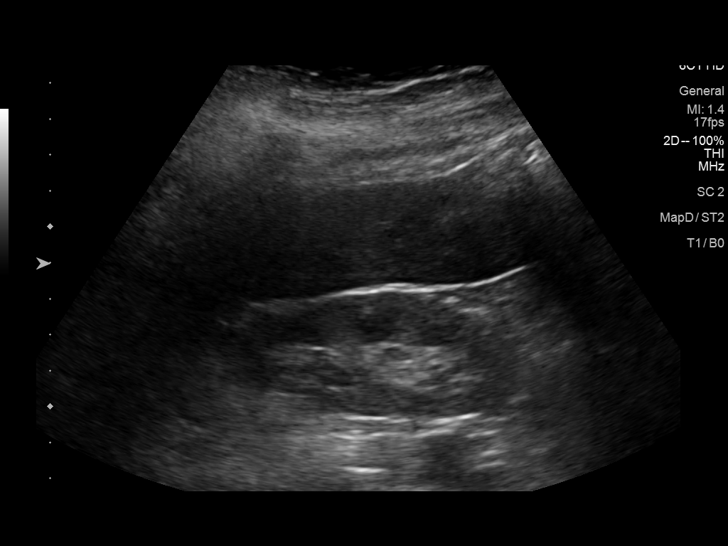
[im 11/32]
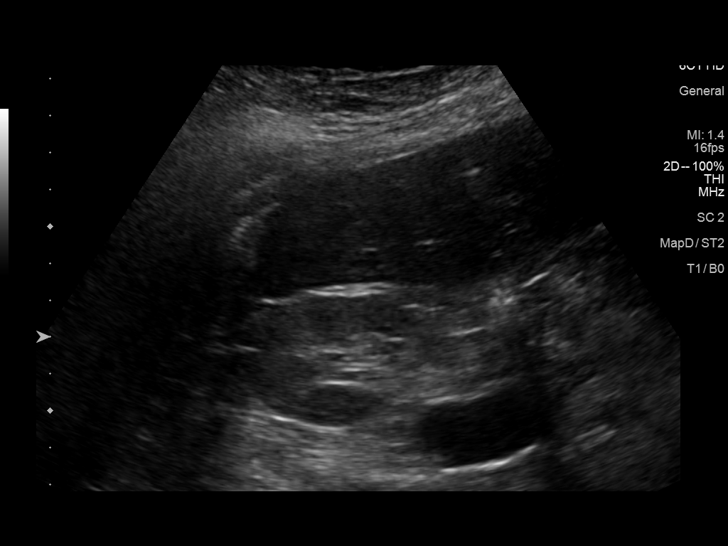
[im 12/32]
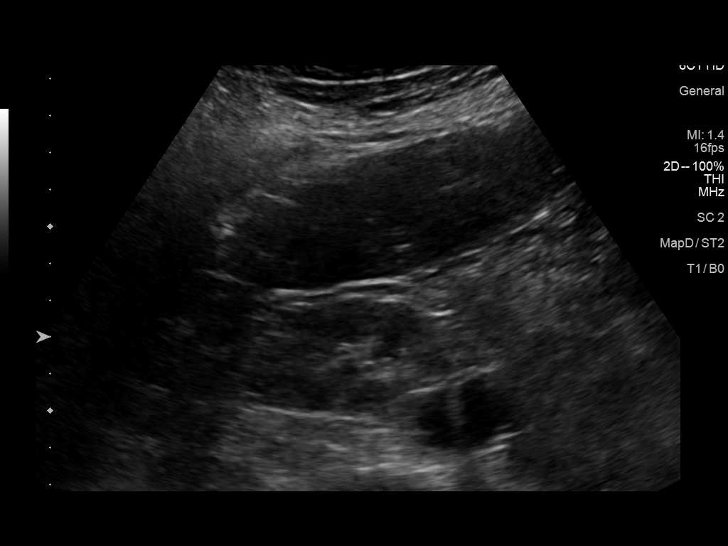
[im 15/32]
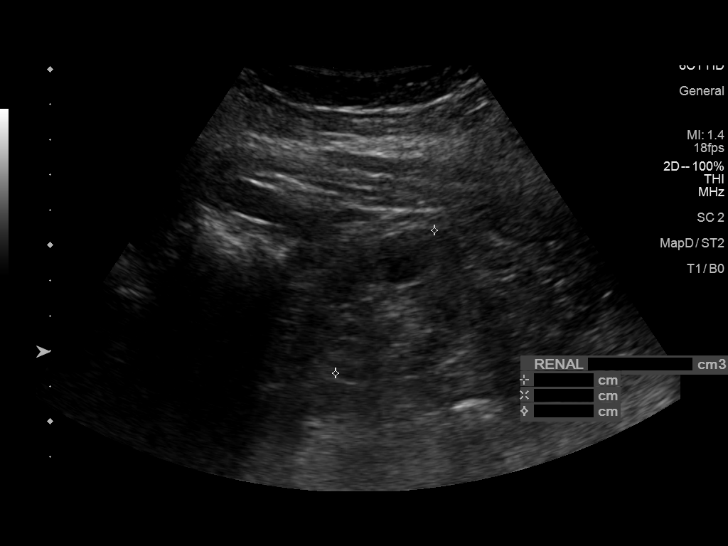
[im 17/32]
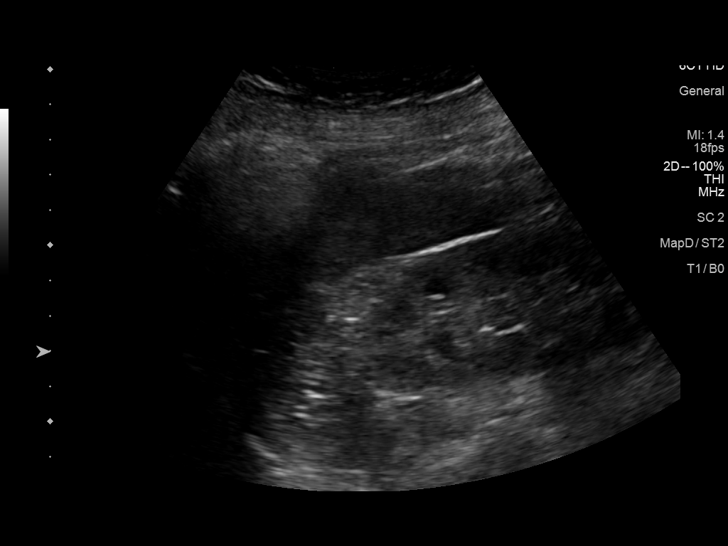
[im 20/32]
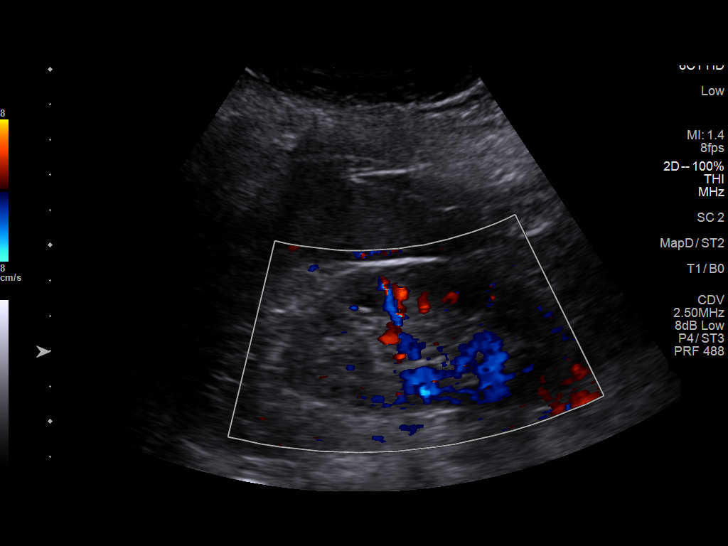
[im 21/32]
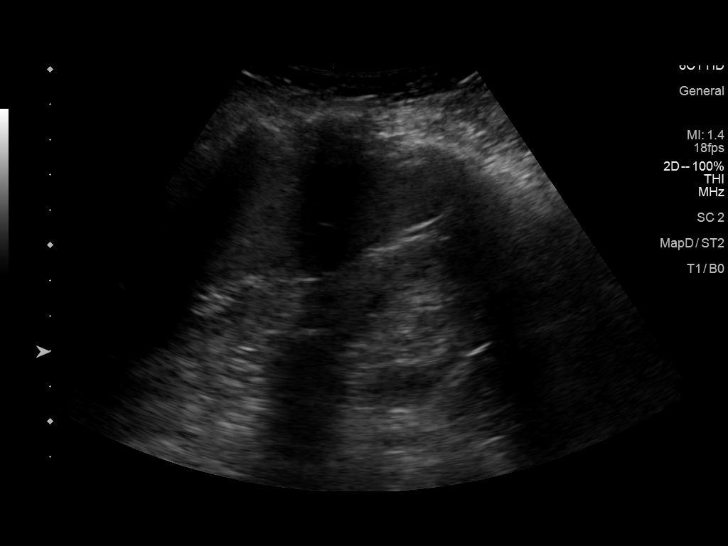
[im 24/32]
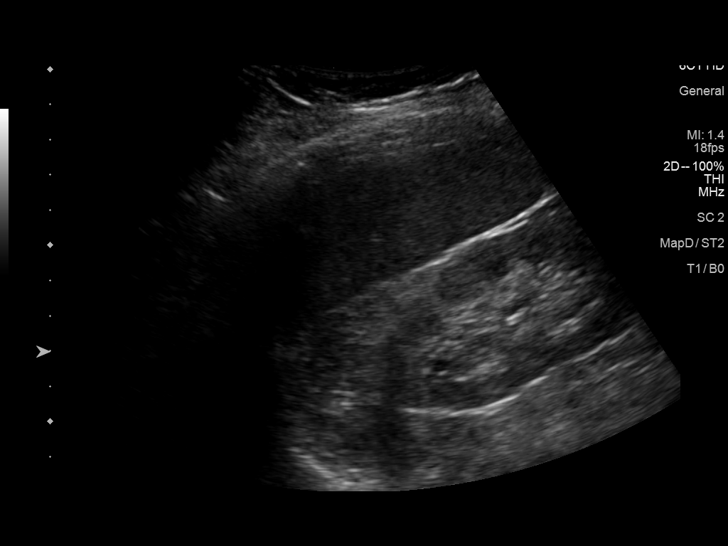
[im 26/32]
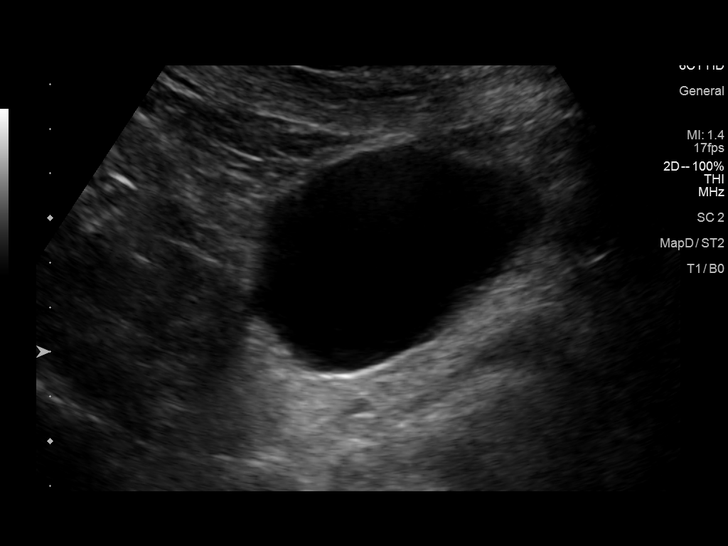
[im 29/32]
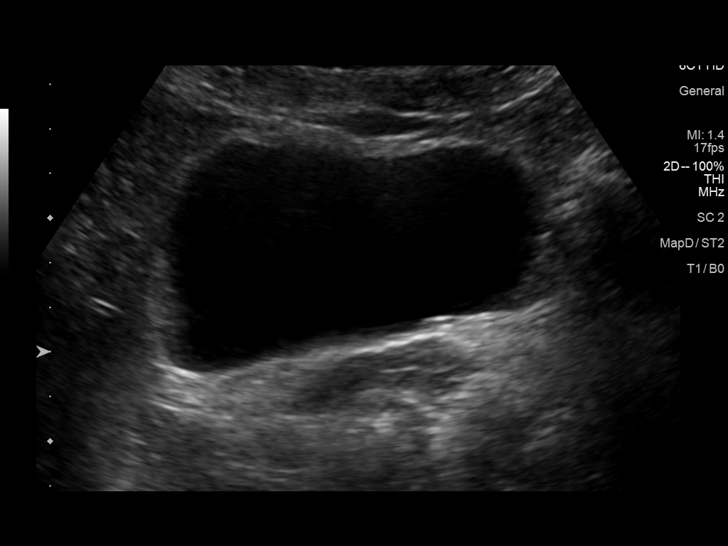
[im 32/32]
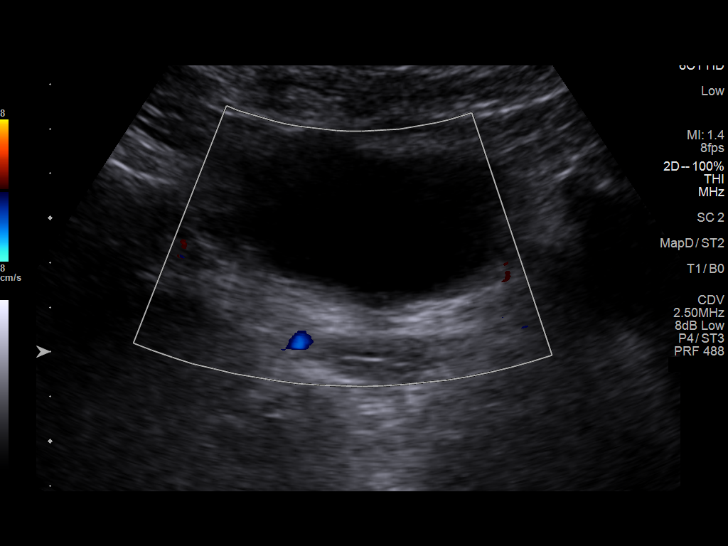

[14 of 25 positions shown; findings below may reference images not displayed]

FINDINGS: Right Kidney:

Renal measurements: 8.9 x 3.5 x 4.7 cm = volume: 76 mL. Mild
increased cortical echogenicity. No mass or hydronephrosis
visualized.

Left Kidney:

Renal measurements: 9.1 x 4.0 x 5.0 cm = volume: 92 mL. Mild
increased cortical echogenicity. No mass or hydronephrosis
visualized.

Bladder:

Appears normal for degree of bladder distention.

Other:

None.
IMPRESSION: Mild increased cortical echogenicity consistent with chronic medical
renal disease.

## 2023-06-26 ENCOUNTER — Other Ambulatory Visit: Payer: BC Managed Care – PPO

## 2023-07-05 DIAGNOSIS — N184 Chronic kidney disease, stage 4 (severe): Secondary | ICD-10-CM | POA: Diagnosis not present

## 2023-07-08 ENCOUNTER — Other Ambulatory Visit: Payer: BC Managed Care – PPO

## 2023-07-08 NOTE — Progress Notes (Signed)
4 month follow up  Assessment and Plan:  Renovascular hypertension - continue medications, DASH diet, exercise and monitor at home. Call if greater than 130/80.  -     CBC with Differential/Platelet -     CMP/GFR -     TSH -     Urinalysis, Routine w reflex microscopic -     Microalbumin / creatinine urine ratio -     EKG  Chronic kidney disease (CKD), stage 4 (HCC) Progressed from CKD 3b and re-established with [facility] Continue enalapril  Increase fluids, avoid NSAIDS, monitor sugars, will monitor Had normal PTH 2022 -     CMP/GFR -     UA, Micralbumin  Hyperlipidemia, unspecified hyperlipidemia type -mild elevations managed by lifestyle, check lipids, decrease fatty foods, increase activity.  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 Screening labs and tests as requested with regular follow-up as recommended. Over 40 minutes of exam, counseling, chart review, and complex, high level critical decision making was performed this visit.   Future Appointments  Date Time Provider Department Center  07/09/2023  3:30 PM Raynelle Dick, NP [facility] None  08/06/2023  9:00 AM [facility] [address] [facility]  03/18/2024  9:00 AM Adela Glimpse, NP [facility] None     HPI  47 y.o. female  presents for a complete physical and follow up for has History of osteosarcoma; Hypertension; Hyperlipidemia; Abnormal glucose; Medication management; Vitamin D deficiency;  Obesity (BMI 30.0-34.9); Eczema of both hands; Bilateral sensorineural hearing loss; and CKD (chronic kidney disease) stage 4, GFR 15-29 ml/min (HCC) on their problem list..  She is married, she has twins that, 52 boy and girl, doing well. 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   She follows with Dr. Rana Snare at [facility], goes annually. 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 Getting annual mammograms there.   She had history of osteosarcoma s/p chem in 2000, follows with [facility] onc q2y. Chemotherapy resulted in CKD IIIb, proteinuria resolved on ACEi, BP controlled. Had normal PTH. Progressed to stage 4 and now re-established with [facility], has follow up next week.   She is on celexa for OCD/Anxiety and doing well. Didn't do well stopping med several years ago, hasn't tried reduced dose of 20 mg, receptive to trial.   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 Altamese Cabal and recommended hearing aids, got in 12/2020 and doing well.   BMI is There is no height or weight on file to calculate BMI., she has been working on diet and exercise. She is trying to walk as much as possible, at least 1/2 mile per day, 2-3 miles on the weekend.   She is avoid soda and fried foods. Does lots of chicken, red meat 2-3 times. Reports 1-2 veggies per day, minimal fruit.  1 cup of coffee, 5-6 glasses of water.  Wt Readings from Last 3 Encounters:  03/19/23 184 lb 3.2 oz (83.6 kg)  01/16/23 182 lb (82.6 kg)  12/14/22 181 lb 6.4 oz (82.3 kg)   Her blood pressure has been controlled at home, on enalapril 5 mg daily, today their BP is    She does workout,   She denies chest pain, shortness of breath, dizziness.  She is not on cholesterol medication and denies myalgias. Her cholesterol is not at goal. The cholesterol last visit was:   Lab Results  Component Value Date   CHOL 207 (H) 03/19/2023   HDL 51 03/19/2023   LDLCALC 129 (H) 03/19/2023   TRIG 155 (H)  03/19/2023   CHOLHDL 4.1 03/19/2023    Last A1C in the office was:  Lab Results  Component Value Date   HGBA1C 5.2 03/19/2023   CKD 4 (chemotherapy), now following with [facility] Dr. Allena Katz. Has follow up planned May 2023. Last GFR: Lab Results  Component Value Date   EGFR 27 (L) 03/19/2023   EGFR 28 (L) 12/17/2022   EGFR 28 (L) 12/14/2022   Patient is on Vitamin D supplement, 4000 IU daily  Lab Results  Component Value Date   VD25OH 72 03/19/2023     Lab Results  Component Value Date   VITAMINB12 337 03/15/2022     Current Medications:  Current Outpatient Medications on File Prior to Visit  Medication Sig Dispense Refill   amoxicillin (AMOXIL) 500 MG capsule Take      2 capsules   1 to 2 hours        prior to Dental Procedure (Patient not taking: Reported on 05/14/2022) 30 capsule 0   Cholecalciferol 100 MCG (4000 UT) CAPS Take 1 capsule by mouth daily.     citalopram (CELEXA) 40 MG tablet TAKE ONE TABLET BY MOUTH DAILY FOR MOOD 90 tablet 3   clobetasol ointment (TEMOVATE) 0.05 % Apply 1 Application topically 2 (two) times daily. Apply topically twice a day for up to two weeks. Alternate with Tacrolimus 30 g 0   Cyanocobalamin (B-12 DOTS SL) Place under the tongue daily.     enalapril (VASOTEC) 5 MG tablet TAKE ONE TABLET BY MOUTH DAILY  FOR BLOOD PRESSURE AND KIDNEY PROTECTION 90 tablet 3   hydrocortisone 2.5 % cream Apply topically 2 (two) times daily.     hydrOXYzine (ATARAX) 10 MG tablet Take 1 tablet (10 mg total) by mouth 3 (three) times daily as needed. 30 tablet 0   norethindrone-ethinyl estradiol-iron (MICROGESTIN FE,GILDESS FE,LOESTRIN FE) 1.5-30 MG-MCG tablet Take 1 tablet by mouth daily.     phentermine (ADIPEX-P) 37.5 MG tablet Take 1/2 every  morning. 30 tablet 0   tacrolimus (PROTOPIC) 0.1 % ointment Apply topically 2 (two) times daily. Apply topically twice a day for two weeks. Alternate with Clobetasol 100 g 0   No current facility-administered  medications on file prior to visit.   Allergies:  No Known Allergies   Medical History:  She has History of osteosarcoma; Hypertension; Hyperlipidemia; Abnormal glucose; Medication management; Vitamin D deficiency; Obesity (BMI 30.0-34.9); Eczema of both hands; Bilateral sensorineural hearing loss; and CKD (chronic kidney disease) stage 4, GFR 15-29 ml/min (HCC) on their problem list.     Patient Care Team: Lucky Cowboy, MD as PCP - General (Internal Medicine) Christia Reading, MD as Consulting Physician (Otolaryngology)  Surgical History:  She has a past surgical history that includes Right leg replacement; LEEP; Tumor removal (Right, 2011); Cesarean section (09/08/2012); Patella fracture surgery (Right, 12/2019);  and Wisdom tooth extraction. Family History:  Herfamily history includes Colon cancer in her paternal great-grandmother; Diabetes in her father; Hyperlipidemia in her maternal grandfather; Hypertension in her father and maternal grandfather; Lung cancer in her paternal grandmother. Social History:  She reports that she quit smoking about 26 years ago. Her smoking use included cigarettes. She started smoking about 32 years ago. She has a 3 pack-year smoking history. She has never been exposed to tobacco smoke. She has never used smokeless tobacco. She reports current alcohol use of about 2.0 standard drinks of alcohol per week. She reports that she does not currently use drugs after having used the following drugs: Marijuana.   Review of Systems: Review of Systems  Constitutional: Negative.  Negative for malaise/fatigue and weight loss.  HENT:  Positive for hearing loss and tinnitus. Negative for ear discharge, ear pain, nosebleeds, sinus pain and sore throat.   Eyes: Negative.  Negative for blurred vision and double vision.  Respiratory: Negative.  Negative for cough, shortness of breath, wheezing and stridor.   Cardiovascular: Negative.  Negative for chest pain, palpitations,  orthopnea, claudication and leg swelling.  Gastrointestinal: Negative.  Negative for abdominal pain, blood in stool, constipation, diarrhea, heartburn, melena, nausea and vomiting.  Genitourinary: Negative.   Musculoskeletal:  Negative for back pain, falls, joint pain, myalgias and neck pain.  Skin:  Negative for itching and rash.  Neurological: Negative.  Negative for dizziness, tingling, sensory change, weakness and headaches.  Endo/Heme/Allergies: Negative.  Negative for polydipsia.  Psychiatric/Behavioral: Negative.    All other systems reviewed and are negative.   Physical Exam: Estimated body mass index is 32.12 kg/m as calculated from the following:   Height as of 03/19/23: 5' 3.5" (1.613 m).   Weight as of 03/19/23: 184 lb 3.2 oz (83.6 kg). There were no vitals taken for this visit. General Appearance: Well nourished, in no apparent distress.  Eyes: PERRLA, EOMs, conjunctiva no swelling or erythema Sinuses: No Frontal/maxillary tenderness  ENT/Mouth: Ext aud canals clear, normal light reflex with TMs without erythema, bulging. Good dentition. No erythema, swelling, or exudate on post pharynx. Tonsils not swollen or erythematous. HOH with bil hearing aids.  Neck: Supple, thyroid normal. No bruits  Respiratory: Respiratory effort normal, BS equal bilaterally without rales, rhonchi, wheezing or stridor.  Cardio: RRR without murmurs, rubs or gallops. Brisk peripheral pulses without edema.  Chest: symmetric, with normal excursions and percussion.  Breasts: defer to GYN Abdomen: Soft, nontender, no guarding, rebound, hernias, masses, or organomegaly.  Lymphatics: Non tender without lymphadenopathy.  Genitourinary: defer to GYN Musculoskeletal: Full ROM all peripheral extremities, 5/5 strength, and normal gait.  Skin: Warm, dry without rashes, lesions, ecchymosis. R great toenail with separation of medial nail with yellowing.  Neuro: Cranial nerves intact, reflexes equal bilaterally.  Normal muscle tone, no cerebellar symptoms. Sensation intact.  Psych: Awake and oriented X 3, normal affect, Insight and Judgment appropriate.      Hollie Salk, DNP, AGNP-C 1:17 PM [facility]

## 2023-07-09 ENCOUNTER — Ambulatory Visit (INDEPENDENT_AMBULATORY_CARE_PROVIDER_SITE_OTHER): Payer: BC Managed Care – PPO | Admitting: Nurse Practitioner

## 2023-07-09 ENCOUNTER — Encounter: Payer: Self-pay | Admitting: Nurse Practitioner

## 2023-07-09 VITALS — BP 96/68 | HR 77 | Temp 97.7°F | Ht 63.5 in | Wt 161.8 lb

## 2023-07-09 DIAGNOSIS — I150 Renovascular hypertension: Secondary | ICD-10-CM

## 2023-07-09 DIAGNOSIS — R7309 Other abnormal glucose: Secondary | ICD-10-CM | POA: Diagnosis not present

## 2023-07-09 DIAGNOSIS — E669 Obesity, unspecified: Secondary | ICD-10-CM

## 2023-07-09 DIAGNOSIS — E559 Vitamin D deficiency, unspecified: Secondary | ICD-10-CM

## 2023-07-09 DIAGNOSIS — N184 Chronic kidney disease, stage 4 (severe): Secondary | ICD-10-CM

## 2023-07-09 DIAGNOSIS — D631 Anemia in chronic kidney disease: Secondary | ICD-10-CM | POA: Diagnosis not present

## 2023-07-09 DIAGNOSIS — E785 Hyperlipidemia, unspecified: Secondary | ICD-10-CM

## 2023-07-09 DIAGNOSIS — N2581 Secondary hyperparathyroidism of renal origin: Secondary | ICD-10-CM | POA: Diagnosis not present

## 2023-07-09 DIAGNOSIS — Z79899 Other long term (current) drug therapy: Secondary | ICD-10-CM | POA: Diagnosis not present

## 2023-07-09 DIAGNOSIS — Z85830 Personal history of malignant neoplasm of bone: Secondary | ICD-10-CM

## 2023-07-09 DIAGNOSIS — I129 Hypertensive chronic kidney disease with stage 1 through stage 4 chronic kidney disease, or unspecified chronic kidney disease: Secondary | ICD-10-CM | POA: Diagnosis not present

## 2023-07-09 NOTE — Patient Instructions (Signed)

## 2023-08-06 ENCOUNTER — Ambulatory Visit
Admission: RE | Admit: 2023-08-06 | Discharge: 2023-08-06 | Disposition: A | Discharge status: Home / Self Care | Payer: BC Managed Care – PPO | Source: Ambulatory Visit | Attending: Nurse Practitioner | Admitting: Nurse Practitioner

## 2023-08-06 DIAGNOSIS — Z136 Encounter for screening for cardiovascular disorders: Secondary | ICD-10-CM

## 2023-08-06 DIAGNOSIS — E785 Hyperlipidemia, unspecified: Secondary | ICD-10-CM

## 2023-08-06 DIAGNOSIS — R079 Chest pain, unspecified: Secondary | ICD-10-CM | POA: Diagnosis not present

## 2023-08-06 DIAGNOSIS — Z6832 Body mass index (BMI) 32.0-32.9, adult: Secondary | ICD-10-CM

## 2023-08-07 ENCOUNTER — Encounter: Payer: Self-pay | Admitting: Nurse Practitioner

## 2023-11-13 DIAGNOSIS — N184 Chronic kidney disease, stage 4 (severe): Secondary | ICD-10-CM | POA: Diagnosis not present

## 2023-11-18 DIAGNOSIS — N1832 Chronic kidney disease, stage 3b: Secondary | ICD-10-CM | POA: Diagnosis not present

## 2023-11-18 DIAGNOSIS — D631 Anemia in chronic kidney disease: Secondary | ICD-10-CM | POA: Diagnosis not present

## 2023-11-18 DIAGNOSIS — N2581 Secondary hyperparathyroidism of renal origin: Secondary | ICD-10-CM | POA: Diagnosis not present

## 2023-11-18 DIAGNOSIS — I129 Hypertensive chronic kidney disease with stage 1 through stage 4 chronic kidney disease, or unspecified chronic kidney disease: Secondary | ICD-10-CM | POA: Diagnosis not present

## 2023-11-20 ENCOUNTER — Encounter: Payer: Self-pay | Admitting: Internal Medicine

## 2023-11-25 ENCOUNTER — Other Ambulatory Visit: Payer: Self-pay

## 2023-11-25 DIAGNOSIS — I150 Renovascular hypertension: Secondary | ICD-10-CM

## 2023-11-25 MED ORDER — ENALAPRIL MALEATE 5 MG PO TABS
ORAL_TABLET | ORAL | 3 refills | Status: AC
Start: 2023-11-25 — End: ?

## 2024-03-16 DIAGNOSIS — N1832 Chronic kidney disease, stage 3b: Secondary | ICD-10-CM | POA: Diagnosis not present

## 2024-03-18 ENCOUNTER — Encounter: Payer: BC Managed Care – PPO | Admitting: Nurse Practitioner

## 2024-03-18 DIAGNOSIS — D631 Anemia in chronic kidney disease: Secondary | ICD-10-CM | POA: Diagnosis not present

## 2024-03-18 DIAGNOSIS — N2581 Secondary hyperparathyroidism of renal origin: Secondary | ICD-10-CM | POA: Diagnosis not present

## 2024-03-18 DIAGNOSIS — N1832 Chronic kidney disease, stage 3b: Secondary | ICD-10-CM | POA: Diagnosis not present

## 2024-03-18 DIAGNOSIS — I129 Hypertensive chronic kidney disease with stage 1 through stage 4 chronic kidney disease, or unspecified chronic kidney disease: Secondary | ICD-10-CM | POA: Diagnosis not present

## 2024-04-01 ENCOUNTER — Ambulatory Visit: Payer: BC Managed Care – PPO | Admitting: Physician Assistant

## 2024-04-01 ENCOUNTER — Encounter: Payer: Self-pay | Admitting: Physician Assistant

## 2024-04-01 VITALS — BP 110/70 | HR 82 | Temp 98.1°F | Ht 63.0 in | Wt 158.0 lb

## 2024-04-01 DIAGNOSIS — E663 Overweight: Secondary | ICD-10-CM

## 2024-04-01 DIAGNOSIS — Z0000 Encounter for general adult medical examination without abnormal findings: Secondary | ICD-10-CM | POA: Diagnosis not present

## 2024-04-01 DIAGNOSIS — N184 Chronic kidney disease, stage 4 (severe): Secondary | ICD-10-CM

## 2024-04-01 DIAGNOSIS — F419 Anxiety disorder, unspecified: Secondary | ICD-10-CM

## 2024-04-01 DIAGNOSIS — D631 Anemia in chronic kidney disease: Secondary | ICD-10-CM | POA: Insufficient documentation

## 2024-04-01 DIAGNOSIS — M79604 Pain in right leg: Secondary | ICD-10-CM

## 2024-04-01 DIAGNOSIS — R911 Solitary pulmonary nodule: Secondary | ICD-10-CM

## 2024-04-01 DIAGNOSIS — E785 Hyperlipidemia, unspecified: Secondary | ICD-10-CM | POA: Diagnosis not present

## 2024-04-01 DIAGNOSIS — Z85830 Personal history of malignant neoplasm of bone: Secondary | ICD-10-CM | POA: Diagnosis not present

## 2024-04-01 NOTE — Patient Instructions (Signed)
 It was great to meet you!  Please reach out if you would like to pursue xray here at our office for your knee  You will be due for a non-contrast CT of your chest in Sep to follow up on the lung nodules found on your calcium score -- please let me know if you would like for us  to order this or if you will have your [facility] provider order this  Consider eye exam  Please go to the lab for blood work.   Our office will call you with your results unless you have chosen to receive results via MyChart.  If your blood work is normal we will follow-up each year for physicals and as scheduled for chronic medical problems.  If anything is abnormal we will treat accordingly and get you in for a follow-up.  Take care,  Delaynie Stetzer

## 2024-04-01 NOTE — Progress Notes (Signed)
 Subjective:    Jackie Wilson is a 48 y.o. female and is here to establish care and a comprehensive physical exam.  HPI  Health Maintenance Due  Topic Date Due   Pneumococcal Vaccine 41-72 Years old (2 of 2 - PCV) 11/24/2014   Cervical Cancer Screening (HPV/Pap Cotest)  06/13/2020   DEXA SCAN  11/17/2023   Acute Concerns: Lateral lower leg pain: Pt reports lower leg pain laterally.  This occurred after doing some lifting/pushing of items at a grocery store. She states the pain has worsened to the point of occasional limping.  She has not been taking any medication to manage the pain.  She has still been able to continue with her exercise regimen.   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  She did 3.5 months of chemotherapy, required surgery and needed another round of chemotherapy.  She follows up with specialists every 2 years at [facility] for continued surveillance.  She gets right leg and chest x-rays at these follow ups, her next f/u being in August.  Per pt, these follow ups will eventually be more spaced out. She also reports a hx of patella fracture several years ago.   CKD, stage 4 Started due to side effect(s) of chemotherapy Condition progressed from CKD 3b to stage 4.  She has re-established care and sees Dr. Lydia Sams at [facility] every 4 months.  She is currently on enalapril  5 mg once daily and Jardiance 10 mg daily.  Good compliance and tolerance to both medications.   Anxiety  Pt is on Celexa  40 mg once daily.  Tolerating well with no side effects.  Condition/moods are overall stable.  No acute concerns reported today.   Weight management Pt was on phentermine  in the past.   Since stopping phentermine  she has been able to maintain her weight.  She has been working on [employer] and regular exercise.   Health Maintenance: Immunizations -- See above.  Colonoscopy -- UTD, last done 05/30/22. Polyps resected and retrieved. Otherwise normal.  Mammogram -- UpToDate -- requesting records finding.  PAP -- UpToDate - requesting records Bone Density -- Last done 11/16/20. Results were normal; "within expected range for age".  Diet -- Healthy and well-balanced diet. Avoids adding salt to foods.  Exercise -- Exercises regularly throughout the week.   Sleep habits -- No concerns.  Mood -- Stable.  UTD with dentist? - Yes UTD with eye doctor? - Wears readers, states she needs to schedule an appt soon.   Weight history: Wt Readings from Last 10 Encounters:  04/01/24 158 lb (71.7 kg)  07/09/23 161 lb 12.8 oz (73.4 kg)  03/19/23 184 lb 3.2 oz (83.6 kg)  01/16/23 182 lb (82.6 kg)  12/14/22 181 lb 6.4 oz (82.3 kg)  05/30/22 176 lb 3.2 oz (79.9 kg)  05/14/22 176 lb 3.2 oz (79.9 kg)  04/12/22 177 lb (80.3 kg)  03/15/22 175 lb 9.6 oz (79.7 kg)  03/15/21 172 lb 3.2 oz (78.1 kg)   Body mass index is 27.99 kg/m. Patient's last menstrual period was 03/12/2024 (approximate).  Alcohol use:  reports current alcohol use of about 2.0 standard drinks of alcohol per week.  Tobacco use:  Tobacco Use: Medium  Risk (04/01/2024)   Patient History    Smoking Tobacco Use: Former    Smokeless Tobacco Use: Never    Passive Exposure: Never   Eligible for lung cancer screening? no     04/01/2024    2:48 PM  Depression screen PHQ 2/9  Decreased Interest 0  Down, Depressed, Hopeless 0  PHQ - 2 Score 0     Other providers/specialists: Patient Care Team: Alexander Iba, [place] as PCP - General (Physician Assistant) Virgina Grills, MD as Consulting Physician (Otolaryngology)    PMHx, SurgHx, SocialHx, Medications, and Allergies were reviewed in the Visit Navigator and  updated as appropriate.   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   OCD (obsessive compulsive disorder)    Renal insufficiency    Secondary to chemotherapy   Twin pregnancy, antepartum 08/12/2012   Di/Di    Start fetal testing @ 32wks      Past Surgical History:  Procedure Laterality Date   CESAREAN SECTION  09/08/2012   Procedure: CESAREAN SECTION;  Surgeon: Denette Finner, MD;  Location: [facility];  Service: Obstetrics;  Laterality: N/A;   LEEP     PATELLA FRACTURE SURGERY Right 12/2019   At [facility]   Right leg replacement     after tumor removal, from thigh to shin   TUMOR REMOVAL Right 2011   Right leg, hardware removal with cement implantation   WISDOM TOOTH EXTRACTION       Family History  Problem Relation Age of Onset   Diabetes Father    Hypertension Father    Anxiety disorder Sister    Hypertension Maternal Grandfather    Hyperlipidemia Maternal Grandfather    Lung cancer Paternal Grandmother        smoker   Colon cancer Paternal Great-grandmother        73s   Other Neg Hx    Crohn's disease Neg Hx    Esophageal cancer Neg Hx    Rectal cancer Neg Hx    Stomach cancer Neg Hx     Social History   Tobacco Use   Smoking status: Former    Current packs/day: 0.00    Average packs/day: 0.5 packs/day for 6.0 years (3.0 ttl pk-yrs)    Types: Cigarettes    Start date: 12/03/1990    Quit date: 12/03/1996    Years since quitting: 27.3    Passive exposure: Never   Smokeless tobacco: Never  Vaping Use   Vaping status: Never Used  Substance Use Topics   Alcohol use: Yes    Alcohol/week: 2.0 standard drinks of alcohol    Types: 2 Standard drinks or equivalent per week   Drug use: Not Currently    Types: Marijuana    Comment: remote as a teen     Review of Systems:   Review of Systems  Constitutional:  Negative for chills, fever,  malaise/fatigue and weight loss.  HENT:  Negative for hearing loss, sinus pain and sore throat.   Respiratory:  Negative for cough and hemoptysis.   Cardiovascular:  Negative for chest pain, palpitations, leg swelling and PND.  Gastrointestinal:  Negative for abdominal pain, constipation, diarrhea, heartburn, nausea and vomiting.  Genitourinary:  Negative for dysuria, frequency and urgency.  Musculoskeletal:  Negative for back pain, myalgias and neck pain.  Skin:  Negative for itching and rash.  Neurological:  Negative for dizziness, tingling,  seizures and headaches.  Endo/Heme/Allergies:  Negative for polydipsia.  Psychiatric/Behavioral:  Negative for depression. The patient is not nervous/anxious.      Objective:   BP 110/70 (BP Location: Left Arm, Patient Position: Sitting, Cuff Size: Normal)   Pulse 82   Temp 98.1 F (36.7 C) (Temporal)   Ht 5\' 3"  (1.6 m)   Wt 158 lb (71.7 kg)   LMP 03/12/2024 (Approximate)   SpO2 98%   BMI 27.99 kg/m  Body mass index is 27.99 kg/m.   General Appearance:    Alert, cooperative, no distress, appears stated age  Head:    Normocephalic, without obvious abnormality, atraumatic  Eyes:    PERRL, conjunctiva/corneas clear, EOM's intact, fundi    benign, both eyes  Ears:    Normal TM's and external ear canals, both ears  Nose:   Nares normal, septum midline, mucosa normal, no drainage    or sinus tenderness  Throat:   Lips, mucosa, and tongue normal; teeth and gums normal  Neck:   Supple, symmetrical, trachea midline, no adenopathy;    thyroid :  no enlargement/tenderness/nodules; no carotid   bruit or JVD  Back:     Symmetric, no curvature, ROM normal, no CVA tenderness  Lungs:     Clear to auscultation bilaterally, respirations unlabored  Chest Wall:    No tenderness or deformity   Heart:    Regular rate and rhythm, S1 and S2 normal, no murmur, rub or gallop  Breast Exam:    Deferred  Abdomen:     Soft, non-tender, bowel sounds active all four  quadrants,    no masses, no organomegaly  Genitalia:    Deferred  Extremities:   Extremities normal, atraumatic, no cyanosis or edema  Pulses:   2+ and symmetric all extremities  Skin:   Skin color, texture, turgor normal, no rashes or lesions  Lymph nodes:   Cervical, supraclavicular, and axillary nodes normal  Neurologic:   CNII-XII intact, normal strength, sensation and reflexes    throughout    Assessment/Plan:   Routine physical examination Today patient counseled on age appropriate routine health concerns for screening and prevention, each reviewed and up to date or declined. Immunizations reviewed and up to date or declined. Labs ordered and reviewed. Risk factors for depression reviewed and negative. Hearing function and visual acuity are intact. ADLs screened and addressed as needed. Functional ability and level of safety reviewed and appropriate. Education, counseling and referrals performed based on assessed risks today. Patient provided with a copy of personalized plan for preventive services.  History of osteosarcoma Reviewed Compliant with ongoing care from [facility]  Right leg pain We discussed imaging due to her history of osteosarcoma -- she has scheduled follow up in several months with [facility] -- recommended she reach out to see if they can get imaging early or to see if we can do and they review -- she will let us  know what she decides  CKD (chronic kidney disease) stage 4, GFR 15-29 ml/min (HCC) Compliant with nephrology recommendations Continue excellent blood pressure control  Hyperlipidemia, unspecified hyperlipidemia type Update lipid panel; calcium score in 2024 was 0  Anxiety Well controlled Continue celexa  40 mg daily Follow up in 1 year  Overweight Improved Continue healthy lifestyle efforts  Lung nodule seen on imaging study Reviewed incidental finding Advised as follows -- "You will be due for a non-contrast CT of your chest in Sep to follow up on the  lung nodules found on your calcium score --  please let me know if you would like for us  to order this or if you will have your [facility] provider order this"   I, Bernita Bristle, acting as a scribe for Alexander Iba, [place]., have documented all relevant documentation on the behalf of Alexander Iba, [place], as directed by   while in the presence of Alexander Iba, [place].  I, Alexander Iba, [place], have reviewed all documentation for this visit. The documentation on 04/01/24 for the exam, diagnosis, procedures, and orders are all accurate and complete.  Alexander Iba, PA-C [facility]

## 2024-04-02 ENCOUNTER — Encounter: Payer: Self-pay | Admitting: Physician Assistant

## 2024-04-02 LAB — COMPREHENSIVE METABOLIC PANEL WITH GFR
ALT: 13 U/L (ref 0–35)
AST: 17 U/L (ref 0–37)
Albumin: 4.1 g/dL (ref 3.5–5.2)
Alkaline Phosphatase: 54 U/L (ref 39–117)
BUN: 38 mg/dL — ABNORMAL HIGH (ref 6–23)
CO2: 21 meq/L (ref 19–32)
Calcium: 9 mg/dL (ref 8.4–10.5)
Chloride: 104 meq/L (ref 96–112)
Creatinine, Ser: 2.24 mg/dL — ABNORMAL HIGH (ref 0.40–1.20)
GFR: 25.49 mL/min — ABNORMAL LOW (ref >60.00)
Glucose, Bld: 83 mg/dL (ref 70–99)
Potassium: 4.3 meq/L (ref 3.5–5.1)
Sodium: 135 meq/L (ref 135–145)
Total Bilirubin: 0.5 mg/dL (ref 0.2–1.2)
Total Protein: 7.3 g/dL (ref 6.0–8.3)

## 2024-04-02 LAB — CBC WITH DIFFERENTIAL/PLATELET
Basophils Absolute: 0.1 10*3/uL (ref 0.0–0.1)
Basophils Relative: 1.2 % (ref 0.0–3.0)
Eosinophils Absolute: 0.1 10*3/uL (ref 0.0–0.7)
Eosinophils Relative: 1.8 % (ref 0.0–5.0)
HCT: 37.6 % (ref 36.0–46.0)
Hemoglobin: 12.3 g/dL (ref 12.0–15.0)
Lymphocytes Relative: 21.2 % (ref 12.0–46.0)
Lymphs Abs: 1.3 10*3/uL (ref 0.7–4.0)
MCHC: 32.8 g/dL (ref 30.0–36.0)
MCV: 86 fl (ref 78.0–100.0)
Monocytes Absolute: 0.6 10*3/uL (ref 0.1–1.0)
Monocytes Relative: 10.2 % (ref 3.0–12.0)
Neutro Abs: 3.9 10*3/uL (ref 1.4–7.7)
Neutrophils Relative %: 65.6 % (ref 43.0–77.0)
Platelets: 243 10*3/uL (ref 150.0–400.0)
RBC: 4.37 Mil/uL (ref 3.87–5.11)
RDW: 13 % (ref 11.5–15.5)
WBC: 5.9 10*3/uL (ref 4.0–10.5)

## 2024-04-02 LAB — LIPID PANEL
Cholesterol: 173 mg/dL (ref 0–200)
HDL: 46.6 mg/dL (ref >39.00)
LDL Cholesterol: 99 mg/dL (ref 0–99)
NonHDL: 126.82
Total CHOL/HDL Ratio: 4
Triglycerides: 140 mg/dL (ref 0.0–149.0)
VLDL: 28 mg/dL (ref 0.0–40.0)

## 2024-05-09 ENCOUNTER — Encounter: Payer: Self-pay | Admitting: Physician Assistant

## 2024-05-11 MED ORDER — CITALOPRAM HYDROBROMIDE 40 MG PO TABS: ORAL_TABLET | ORAL | 3 refills | Status: AC

## 2024-05-11 NOTE — Telephone Encounter (Signed)
 Okay to fill Celexa  40 mg, historical provider?

## 2024-06-02 DIAGNOSIS — Z6827 Body mass index (BMI) 27.0-27.9, adult: Secondary | ICD-10-CM | POA: Diagnosis not present

## 2024-06-02 DIAGNOSIS — Z124 Encounter for screening for malignant neoplasm of cervix: Secondary | ICD-10-CM | POA: Diagnosis not present

## 2024-06-02 DIAGNOSIS — Z1231 Encounter for screening mammogram for malignant neoplasm of breast: Secondary | ICD-10-CM | POA: Diagnosis not present

## 2024-06-02 DIAGNOSIS — Z01419 Encounter for gynecological examination (general) (routine) without abnormal findings: Secondary | ICD-10-CM | POA: Diagnosis not present

## 2024-07-08 ENCOUNTER — Other Ambulatory Visit: Payer: Self-pay | Admitting: Oncology

## 2024-07-08 ENCOUNTER — Ambulatory Visit
Admission: RE | Admit: 2024-07-08 | Discharge: 2024-07-08 | Disposition: A | Discharge status: Home / Self Care | Source: Ambulatory Visit | Attending: Oncology | Admitting: Oncology

## 2024-07-08 DIAGNOSIS — S82031D Displaced transverse fracture of right patella, subsequent encounter for closed fracture with routine healing: Secondary | ICD-10-CM

## 2024-07-08 DIAGNOSIS — C4021 Malignant neoplasm of long bones of right lower limb: Secondary | ICD-10-CM | POA: Diagnosis not present

## 2024-07-08 DIAGNOSIS — Z85830 Personal history of malignant neoplasm of bone: Secondary | ICD-10-CM | POA: Diagnosis not present

## 2024-07-14 DIAGNOSIS — C4021 Malignant neoplasm of long bones of right lower limb: Secondary | ICD-10-CM | POA: Diagnosis not present

## 2024-07-20 DIAGNOSIS — I129 Hypertensive chronic kidney disease with stage 1 through stage 4 chronic kidney disease, or unspecified chronic kidney disease: Secondary | ICD-10-CM | POA: Diagnosis not present

## 2024-07-20 DIAGNOSIS — D631 Anemia in chronic kidney disease: Secondary | ICD-10-CM | POA: Diagnosis not present

## 2024-07-20 DIAGNOSIS — N2581 Secondary hyperparathyroidism of renal origin: Secondary | ICD-10-CM | POA: Diagnosis not present

## 2024-07-20 DIAGNOSIS — N184 Chronic kidney disease, stage 4 (severe): Secondary | ICD-10-CM | POA: Diagnosis not present

## 2024-12-04 ENCOUNTER — Other Ambulatory Visit: Payer: Self-pay | Admitting: Nurse Practitioner

## 2024-12-04 DIAGNOSIS — I150 Renovascular hypertension: Secondary | ICD-10-CM

## 2025-04-05 ENCOUNTER — Encounter: Admitting: Physician Assistant
